# Patient Record
Sex: Female | Born: 1985 | Race: Asian | Hispanic: No | Marital: Married | Smoking: Never smoker
Health system: Southern US, Community
[De-identification: ages and names within clinical notes are randomized; demographics above are authoritative.]

## PROBLEM LIST (undated history)

## (undated) ENCOUNTER — Inpatient Hospital Stay (HOSPITAL_COMMUNITY): Payer: Self-pay

## (undated) DIAGNOSIS — F32A Depression, unspecified: Secondary | ICD-10-CM

## (undated) DIAGNOSIS — O24419 Gestational diabetes mellitus in pregnancy, unspecified control: Secondary | ICD-10-CM

## (undated) DIAGNOSIS — F329 Major depressive disorder, single episode, unspecified: Secondary | ICD-10-CM

## (undated) DIAGNOSIS — K219 Gastro-esophageal reflux disease without esophagitis: Secondary | ICD-10-CM

## (undated) HISTORY — PX: APPENDECTOMY: SHX54

---

## 2015-12-01 ENCOUNTER — Inpatient Hospital Stay (HOSPITAL_COMMUNITY): Payer: BLUE CROSS/BLUE SHIELD

## 2015-12-01 ENCOUNTER — Inpatient Hospital Stay (HOSPITAL_COMMUNITY)
Admission: AD | Admit: 2015-12-01 | Discharge: 2015-12-01 | Disposition: A | Discharge status: Home / Self Care | Payer: BLUE CROSS/BLUE SHIELD | Source: Ambulatory Visit | Attending: Obstetrics and Gynecology | Admitting: Obstetrics and Gynecology

## 2015-12-01 ENCOUNTER — Encounter (HOSPITAL_COMMUNITY): Payer: Self-pay | Admitting: *Deleted

## 2015-12-01 DIAGNOSIS — O209 Hemorrhage in early pregnancy, unspecified: Secondary | ICD-10-CM

## 2015-12-01 DIAGNOSIS — O26891 Other specified pregnancy related conditions, first trimester: Secondary | ICD-10-CM | POA: Diagnosis not present

## 2015-12-01 DIAGNOSIS — Z3491 Encounter for supervision of normal pregnancy, unspecified, first trimester: Secondary | ICD-10-CM

## 2015-12-01 DIAGNOSIS — F329 Major depressive disorder, single episode, unspecified: Secondary | ICD-10-CM | POA: Insufficient documentation

## 2015-12-01 DIAGNOSIS — R12 Heartburn: Secondary | ICD-10-CM | POA: Diagnosis not present

## 2015-12-01 DIAGNOSIS — O99341 Other mental disorders complicating pregnancy, first trimester: Secondary | ICD-10-CM | POA: Diagnosis not present

## 2015-12-01 DIAGNOSIS — O2631 Retained intrauterine contraceptive device in pregnancy, first trimester: Secondary | ICD-10-CM

## 2015-12-01 DIAGNOSIS — O210 Mild hyperemesis gravidarum: Secondary | ICD-10-CM | POA: Diagnosis not present

## 2015-12-01 DIAGNOSIS — O4691 Antepartum hemorrhage, unspecified, first trimester: Secondary | ICD-10-CM | POA: Diagnosis present

## 2015-12-01 DIAGNOSIS — Z3A01 Less than 8 weeks gestation of pregnancy: Secondary | ICD-10-CM | POA: Insufficient documentation

## 2015-12-01 DIAGNOSIS — Z30432 Encounter for removal of intrauterine contraceptive device: Secondary | ICD-10-CM

## 2015-12-01 DIAGNOSIS — O43891 Other placental disorders, first trimester: Secondary | ICD-10-CM

## 2015-12-01 DIAGNOSIS — Z79899 Other long term (current) drug therapy: Secondary | ICD-10-CM | POA: Diagnosis not present

## 2015-12-01 DIAGNOSIS — O219 Vomiting of pregnancy, unspecified: Secondary | ICD-10-CM

## 2015-12-01 HISTORY — DX: Depression, unspecified: F32.A

## 2015-12-01 HISTORY — DX: Major depressive disorder, single episode, unspecified: F32.9

## 2015-12-01 LAB — POCT PREGNANCY, URINE: Preg Test, Ur: POSITIVE — AB

## 2015-12-01 LAB — ABO/RH: ABO/RH(D): A POS

## 2015-12-01 LAB — HCG, QUANTITATIVE, PREGNANCY: hCG, Beta Chain, Quant, S: 48682 m[IU]/mL — ABNORMAL HIGH (ref <5)

## 2015-12-01 LAB — OB RESULTS CONSOLE RPR: RPR: NONREACTIVE

## 2015-12-01 LAB — URINALYSIS, ROUTINE W REFLEX MICROSCOPIC
Bilirubin Urine: NEGATIVE
GLUCOSE, UA: NEGATIVE mg/dL
KETONES UR: NEGATIVE mg/dL
Nitrite: NEGATIVE
PH: 6.5 (ref 5.0–8.0)
Protein, ur: NEGATIVE mg/dL
Specific Gravity, Urine: 1.01 (ref 1.005–1.030)

## 2015-12-01 LAB — WET PREP, GENITAL
CLUE CELLS WET PREP: NONE SEEN
SPERM: NONE SEEN
TRICH WET PREP: NONE SEEN
Yeast Wet Prep HPF POC: NONE SEEN

## 2015-12-01 LAB — OB RESULTS CONSOLE HEPATITIS B SURFACE ANTIGEN: HEP B S AG: NEGATIVE

## 2015-12-01 LAB — URINE MICROSCOPIC-ADD ON

## 2015-12-01 LAB — CBC
HEMATOCRIT: 35.2 % — AB (ref 36.0–46.0)
HEMOGLOBIN: 10.8 g/dL — AB (ref 12.0–15.0)
MCH: 21.1 pg — AB (ref 26.0–34.0)
MCHC: 30.7 g/dL (ref 30.0–36.0)
MCV: 68.8 fL — AB (ref 78.0–100.0)
PLATELETS: 260 10*3/uL (ref 150–400)
RBC: 5.12 MIL/uL — AB (ref 3.87–5.11)
RDW: 16 % — ABNORMAL HIGH (ref 11.5–15.5)
WBC: 12.4 10*3/uL — ABNORMAL HIGH (ref 4.0–10.5)

## 2015-12-01 LAB — OB RESULTS CONSOLE ABO/RH: RH TYPE: POSITIVE

## 2015-12-01 LAB — OB RESULTS CONSOLE RUBELLA ANTIBODY, IGM: Rubella: IMMUNE

## 2015-12-01 LAB — OB RESULTS CONSOLE ANTIBODY SCREEN: Antibody Screen: NEGATIVE

## 2015-12-01 LAB — OB RESULTS CONSOLE GC/CHLAMYDIA
Chlamydia: NEGATIVE
Gonorrhea: NEGATIVE

## 2015-12-01 LAB — OB RESULTS CONSOLE HIV ANTIBODY (ROUTINE TESTING): HIV: NONREACTIVE

## 2015-12-01 MED ORDER — RANITIDINE HCL 150 MG PO TABS: 150.0000 mg | ORAL_TABLET | Freq: Two times a day (BID) | ORAL | 0 refills | Status: AC

## 2015-12-01 MED ORDER — GI COCKTAIL ~~LOC~~
30.0000 mL | Freq: Once | ORAL | Status: AC
  Administered 2015-12-01: 30 mL via ORAL
  Filled 2015-12-01: qty 30

## 2015-12-01 MED ORDER — PROMETHAZINE HCL 25 MG PO TABS
12.5000 mg | ORAL_TABLET | Freq: Once | ORAL | Status: AC
  Administered 2015-12-01: 12.5 mg via ORAL
  Filled 2015-12-01: qty 1

## 2015-12-01 MED ORDER — PROMETHAZINE HCL 12.5 MG PO TABS: 12.5000 mg | ORAL_TABLET | Freq: Four times a day (QID) | ORAL | 0 refills | Status: DC | PRN

## 2015-12-01 NOTE — Discharge Instructions (Signed)
? nng trong khi mang thai  (Heartburn During Pregnancy) ? nng l m?t c?m gic nng rt ? ng?c do axit d? dy tro ng??c ln th?c qu?n gy ra. ? nng ph? bi?n trong th?i k? mang thai v m?t hocmon ? bi?t (progesterone) ???c gi?i phng ra khi ph? n? mang [place]. Hc mn progesterone c th? lm l?ng van ng?n cch th?c qu?n v?i d? dy. ?i?u ny lm cho axit ?i ng??c ln th?c qu?n, gy ? nng. ? nng c?ng c th? x?y ra trong khi mang [place] do t? cung to ra, ??y ng??c ln d? dy, ??y nhi?u axit h?n vo th?c qu?n. ?i?u ny ??c bi?t ?ng trong giai ?o?n cu?i c?a [place] k?. V?n ?? ? nng th??ng bi?n m?t sau khi sinh. NGUYN NHN.  ? nng do axit d? dy tro ng??c ln th?c qu?n gy ra. Trong khi mang thai, vi?c ny c th? do nhi?u nguyn nhn khc nhau, bao g?m:   Hc mn progesterone.  Thay ??i n?ng ?? hc mn.  T? cung pht tri?n, ??y axit d? dy ng??c ln.  B?a ?n no.  M?t s? th?c ph?m v ?? u?ng nh?t ??nh.  T?p luy?n.  T?ng s?n sinh axit. D?U HI?U V TRI?U CH?NG   ?au rt ? ng?c ho?c h?ng d??i.  C?m gic ??ng trong mi?ng.  Ho. CH?N ?ON  Chuyn gia ch?m [place] s?c kh?e s? th??ng ch?n ?on ? nng b?ng cch h?i k? ti?n s? cc v?n ?? lin quan c?a qu vi. C th? c?n xt nghi?m mu ?? ki?m tra m?t lo?i vi khu?n nh?t ??nh c lin quan t?i ? nng. ?i khi, ? nng ???c ch?n ?on b?ng k ??n dng thu?c ?i?u tr? ? nng ?? xem tri?u ch?ng c ???c c?i thi?n khng. Trong m?t s? tr??ng h?p c th? ph?i lm m?t th? thu?t c tn l n?i soi. Trong th? thu?t ny, m?t ?ng c ?n v m?t my ?nh ? ??u (?n n?i soi) ???c s? d?ng ?? ki?m tra th?c qu?n v d? dy. ?I?U TR?  Vi?c ?i?u tr? s? khc nhau ty thu?c vo m?c ?? n?ng c?a cc tri?u ch?ng. Chuyn gia ch?m [place] s?c kh?e c?a qu v? c th? khuy?n ngh?:  S? d?ng m?t s? thu?c khng c?n k ??n (thu?c trung ha axit d?ch v?, thu?c lm gi?m axit d?ch v?) ?? ?i?u tr? ? nng m?c ?? nh?.  Cc lo?i thu?c k ??n lm gi?m axit d? dy ho?c ?? b?o v? nim m?c d? dy.  M?t s?  thay ??i nh?t ??nh trong ch? ?? ?n c?a qu v?.  Nng cao ??u gi??ng c?a qu v? b?ng cch k cc c?c g?ch d??i chn gi??ng. Vi?c ny gip ng?n ng?a a xt trong d? dy khng tro ng??c ln th?c qu?n khi qu v? n?m. H??NG D?N CH?M Early T?I NH   Ch? s? d?ng thu?c khng c?n k ??n ho?c thu?c c?n k ??n theo ch? d?n c?a chuyn gia ch?m [place] s?c kh?e.  Nng cao ??u gi??ng c?a qu v? b?ng cch k cc c?c g?ch d??i chn gi??ng n?u chuyn gia ch?m [place] s?c kh?e yu c?u lm nh? v?y. Dng nhi?u g?i h?n khi ng? l khng hi?u qu? v ?i?u ? ch? lm thay ??i t? th? ??u c?a qu v?.  Khngt?p th? d?c ngay sau khi ?n.  Trnh ?n tr??c khi ?i ng? 2-3 ti?ng. Khng n?m ngay sau khi ?n.  ?n nhi?u b?a nh? trong ngy thay v ?  n 3 b?a no.  Xc ??nh cc lo?i th?c ph?m v ?? u?ng lm cho cc tri?u ch?ng t?i t? h?n v trnh dng chng. Cc lo?i th?c ph?m qu v? c th? mu?n trnh dng bao g?m:  H?t tiu.  S c la.  Th?c ?n nhi?u ch?t bo, bao g?m th?c ?n chin, rn.  Th?c ?n [place].  T?i v hnh.  Cc lo?i qu? thu?c gi?ng cam qut, g?m cam, b??i, chanh, v chanh l cam.  Th?c ph?m ch?a c chua ho?c cc s?n ph?m lm t? c chua.  B?c h.  ?? u?ng c ga v caffein.  Gi?m. ?I KHM N?U:  Qu v? b? b?t k? ki?u ?au b?ng no.  Qu v? c?m th?y nng ? b?ng trn ho?c ng?c, ??c bi?t l sau khi ?n ho?c n?m xu?ng.  Qu v? b? bu?n nn v nn m?a.  Qu v? c?m th?y kh ch?u trong d? dy sau khi ?n. NGAY L?P T?C ?I KHM N?U:   Qu v? b? ?au ng?c n?ng lan xu?ng cnh tay ho?c ra hm ho?c c?.  Qu v? c c?m gic ?? m? hi, hoa m?t hay chng m?t.  Qu v? th?y kh th?.  Qu v? nn ra mu.  Qu v? kh nu?t ho?c b? ?au khi nu?t.  Phn c?a qu v? c mu ho?c c mu ?en nh? h?c n.  Qu v? c nh?ng c?n ? nng nhi?u h?n 3 l?n m?i tu?n, trong h?n 2 tu?n. ??M B?O QU V?:  Hi?u r cc h??ng d?n ny.  S? theo di tnh tr?ng c?a mnh.  S? yu c?u tr? gip ngay l?p t?c n?u qu v? c?m th?y khng kh?e ho?c th?y tr?m  tr?ng h?n.   Thng tin ny khng nh?m m?c ?ch thay th? cho l?i khuyn m chuyn gia ch?m [place] s?c kh?e ni v?i qu v?. Hy b?o ??m qu v? ph?i th?o lu?n b?t k? v?n ?? g m qu v? c v?i chuyn gia ch?m [place] s?c kh?e c?a qu v?.   Document Released: 03/13/2005 Document Revised: 04/03/2014 Elsevier Interactive Patient Education 2016 Elsevier Inc. Pelvic Rest Pelvic rest is sometimes recommended for women when:   The placenta is partially or completely covering the opening of the cervix (placenta previa).  There is bleeding between the uterine wall and the amniotic sac in the first trimester (subchorionic hemorrhage).  The cervix begins to open without labor starting (incompetent cervix, cervical insufficiency).  The labor is too early (preterm labor). HOME CARE INSTRUCTIONS  Do not have sexual intercourse, stimulation, or an orgasm.  Do not use tampons, douche, or put anything in the vagina.  Do not lift anything over 10 pounds (4.5 kg).  Avoid strenuous activity or straining your pelvic muscles. SEEK MEDICAL CARE IF:  You have any vaginal bleeding during pregnancy. Treat this as a potential emergency.  You have cramping pain felt low in the stomach (stronger than menstrual cramps).  You notice vaginal discharge (watery, mucus, or bloody).  You have a low, dull backache.  There are regular contractions or uterine tightening. SEEK IMMEDIATE MEDICAL CARE IF: You have vaginal bleeding and have placenta previa.    This information is not intended to replace advice given to you by your health care provider. Make sure you discuss any questions you have with your health care provider.   Document Released: 07/08/2010 Document Revised: 06/05/2011 Document Reviewed: 09/14/2014 Elsevier Interactive Patient Education 2016 Elsevier Inc.  Subchorionic Hematoma A subchorionic hematoma is a gathering of  blood between the outer wall of the placenta and the inner wall of the womb  (uterus). The placenta is the organ that connects the fetus to the wall of the uterus. The placenta performs the feeding, breathing (oxygen to the fetus), and waste removal (excretory work) of the fetus.  Subchorionic hematoma is the most common abnormality found on a result from ultrasonography done during the first trimester or early second trimester of pregnancy. If there has been little or no vaginal bleeding, early small hematomas usually shrink on their own and do not affect your baby or pregnancy. The blood is gradually absorbed over 1-2 weeks. When bleeding starts later in pregnancy or the hematoma is larger or occurs in an older pregnant woman, the outcome may not be as good. Larger hematomas may get bigger, which increases the chances for miscarriage. Subchorionic hematoma also increases the risk of premature detachment of the placenta from the uterus, preterm (premature) labor, and stillbirth. HOME CARE INSTRUCTIONS  Stay on bed rest if your health care provider recommends this. Although bed rest will not prevent more bleeding or prevent a miscarriage, your health care provider may recommend bed rest until you are advised otherwise.  Avoid heavy lifting (more than 10 lb [4.5 kg]), exercise, sexual intercourse, or douching as directed by your health care provider.  Keep track of the number of pads you use each day and how soaked (saturated) they are. Write down this information.  Do not use tampons.  Keep all follow-up appointments as directed by your health care provider. Your health care provider may ask you to have follow-up blood tests or ultrasound tests or both. SEEK IMMEDIATE MEDICAL CARE IF:  You have severe cramps in your stomach, back, abdomen, or pelvis.  You have a fever.  You pass large clots or tissue. Save any tissue for your health care provider to look at.  Your bleeding increases or you become lightheaded, feel weak, or have fainting episodes.   This information is  not intended to replace advice given to you by your health care provider. Make sure you discuss any questions you have with your health care provider.   Document Released: 06/28/2006 Document Revised: 04/03/2014 Document Reviewed: 10/10/2012 Elsevier Interactive Patient Education 2016 Elsevier Inc.  Morning Sickness Morning sickness is when you feel sick to your stomach (nauseous) during pregnancy. You may feel sick to your stomach and throw up (vomit). You may feel sick in the morning, but you can feel this way any time of day. Some women feel very sick to their stomach and cannot stop throwing up (hyperemesis gravidarum). HOME CARE  Only take medicines as told by your doctor.  Take multivitamins as told by your doctor. Taking multivitamins before getting pregnant can stop or lessen the harshness of morning sickness.  Eat dry toast or unsalted crackers before getting out of bed.  Eat 5 to 6 small meals a day.  Eat dry and bland foods like rice and baked potatoes.  Do not drink liquids with meals. Drink between meals.  Do not eat greasy, fatty, or spicy foods.  Have someone cook for you if the smell of food causes you to feel sick or throw up.  If you feel sick to your stomach after taking prenatal vitamins, take them at night or with a snack.  Eat protein when you need a snack (nuts, yogurt, cheese).  Eat unsweetened gelatins for dessert.  Wear a bracelet used for sea sickness (acupressure wristband).  Go to a doctor  that puts thin needles into certain body points (acupuncture) to improve how you feel.  Do not smoke.  Use a humidifier to keep the air in your house free of odors.  Get lots of fresh air. GET HELP IF:  You need medicine to feel better.  You feel dizzy or lightheaded.  You are losing weight. GET HELP RIGHT AWAY IF:   You feel very sick to your stomach and cannot stop throwing up.  You pass out (faint). MAKE SURE YOU:  Understand these  instructions.  Will watch your condition.  Will get help right away if you are not doing well or get worse.   This information is not intended to replace advice given to you by your health care provider. Make sure you discuss any questions you have with your health care provider.   Document Released: 04/20/2004 Document Revised: 04/03/2014 Document Reviewed: 08/28/2012 Elsevier Interactive Patient Education [employer]

## 2015-12-01 NOTE — MAU Provider Note (Signed)
History     CSN: [id number]  Arrival date and time: 12/01/15 1424   First Provider Initiated Contact with Patient 12/01/15 1613      Chief Complaint  Patient presents with  . Vaginal Bleeding  . Morning Sickness   HPI  Erika Keith is a 30 y.o. G3P1011 at 2229w3d by LMP who presents with vaginal bleeding & nausea. Has IUD in place; placed 5 years ago in [place], doesn't know which IUD it is. Has positive HPT last week.  Reports pink spotting in underwear since this morning. Denies recent intercourse, vaginal discharge, lower abdominal pain, or dysuria.  Also reports n/v since 8/23. Has not vomited today but reports continued nausea. Does not have antiemetic at home. Some heartburn & epigastric soreness. Denies diarrhea or constipation.  Has scheduled initial prenatal visit with an ob[address] but not sure which one.   OB History    Gravida Para Term Preterm AB Living   3 1 1   1 1    SAB TAB Ectopic Multiple Live Births   1       1      Past Medical History:  Diagnosis Date  . Depression     Past Surgical History:  Procedure Laterality Date  . CESAREAN SECTION      Family History  Problem Relation Age of Onset  . Alcohol abuse Neg Hx   . Asthma Neg Hx   . Arthritis Neg Hx   . Birth defects Neg Hx   . Cancer Neg Hx   . COPD Neg Hx   . Depression Neg Hx   . Diabetes Neg Hx   . Drug abuse Neg Hx   . Early death Neg Hx   . Hearing loss Neg Hx   . Heart disease Neg Hx   . Hyperlipidemia Neg Hx   . Hypertension Neg Hx   . Kidney disease Neg Hx   . Learning disabilities Neg Hx   . Mental illness Neg Hx   . Mental retardation Neg Hx   . Miscarriages / Stillbirths Neg Hx   . Stroke Neg Hx   . Vision loss Neg Hx   . Varicose Veins Neg Hx     Social History  Substance Use Topics  . Smoking status: Never Smoker  . Smokeless tobacco: Never Used  . Alcohol use No    Allergies: No Known Allergies  Prescriptions Prior to Admission  Medication Sig  Dispense Refill Last Dose  . acetaminophen (TYLENOL) 325 MG tablet Take 650 mg by mouth every 6 (six) hours as needed for headache.   Past Week at Unknown time  . ibuprofen (ADVIL,MOTRIN) 200 MG tablet Take 200 mg by mouth every 6 (six) hours as needed for headache.   Past Month at Unknown time  . ketoconazole (NIZORAL) 2 % shampoo Apply 1 application topically 2 (two) times a week.  0 Past Week at Unknown time    Review of Systems  Constitutional: Negative.   Gastrointestinal: Positive for heartburn, nausea and vomiting. Negative for abdominal pain, constipation and diarrhea.  Genitourinary: Negative for dysuria.       + vaginal bleeding   Physical Exam   Blood pressure 96/57, pulse 78, temperature 98.2 F (36.8 C), temperature source Oral, resp. rate 16, weight 123 lb 8 oz (56 kg), last menstrual period 10/17/2015.  Physical Exam  Nursing note and vitals reviewed. Constitutional: She is oriented to person, place, and time. She appears well-developed and well-nourished. No distress.  HENT:  Head: Normocephalic and atraumatic.  Eyes: Conjunctivae are normal. Right eye exhibits no discharge. Left eye exhibits no discharge. No scleral icterus.  Neck: Normal range of motion.  Cardiovascular: Normal rate.   Respiratory: Effort normal. No respiratory distress.  GI: Soft. She exhibits no distension. There is no tenderness.  Genitourinary: No bleeding in the vagina. Vaginal discharge (small amount of thin tan discharge) found.  Genitourinary Comments: Cervix closed IUD removed  Neurological: She is alert and oriented to person, place, and time.  Skin: Skin is warm and dry. She is not diaphoretic.  Psychiatric: She has a normal mood and affect. Her behavior is normal. Judgment and thought content normal.    MAU Course  Procedures Results for orders placed or performed during the hospital encounter of 12/01/15 (from the past 24 hour(s))  Urinalysis, Routine w reflex microscopic (not at  [facility])     Status: Abnormal   Collection Time: 12/01/15  3:42 PM  Result Value Ref Range   Color, Urine YELLOW YELLOW   APPearance CLEAR CLEAR   Specific Gravity, Urine 1.010 1.005 - 1.030   pH 6.5 5.0 - 8.0   Glucose, UA NEGATIVE NEGATIVE mg/dL   Hgb urine dipstick SMALL (A) NEGATIVE   Bilirubin Urine NEGATIVE NEGATIVE   Ketones, ur NEGATIVE NEGATIVE mg/dL   Protein, ur NEGATIVE NEGATIVE mg/dL   Nitrite NEGATIVE NEGATIVE   Leukocytes, UA SMALL (A) NEGATIVE  Urine microscopic-add on     Status: Abnormal   Collection Time: 12/01/15  3:42 PM  Result Value Ref Range   Squamous Epithelial / LPF 0-5 (A) NONE SEEN   WBC, UA 6-30 0 - 5 WBC/hpf   RBC / HPF 0-5 0 - 5 RBC/hpf   Bacteria, UA FEW (A) NONE SEEN  Pregnancy, urine POC     Status: Abnormal   Collection Time: 12/01/15  3:54 PM  Result Value Ref Range   Preg Test, Ur POSITIVE (A) NEGATIVE  CBC     Status: Abnormal   Collection Time: 12/01/15  4:26 PM  Result Value Ref Range   WBC 12.4 (H) 4.0 - 10.5 K/uL   RBC 5.12 (H) 3.87 - 5.11 MIL/uL   Hemoglobin 10.8 (L) 12.0 - 15.0 g/dL   HCT 16.1 (L) 09.6 - 04.5 %   MCV 68.8 (L) 78.0 - 100.0 fL   MCH 21.1 (L) 26.0 - 34.0 pg   MCHC 30.7 30.0 - 36.0 g/dL   RDW 40.9 (H) 81.1 - 91.4 %   Platelets 260 150 - 400 K/uL  ABO/Rh     Status: None (Preliminary result)   Collection Time: 12/01/15  4:26 PM  Result Value Ref Range   ABO/RH(D) A POS   hCG, quantitative, pregnancy     Status: Abnormal   Collection Time: 12/01/15  4:26 PM  Result Value Ref Range   hCG, Beta Chain, Quant, S 78,295 (H) <5 mIU/mL  Wet prep, genital     Status: Abnormal   Collection Time: 12/01/15  4:40 PM  Result Value Ref Range   Yeast Wet Prep HPF POC NONE SEEN NONE SEEN   Trich, Wet Prep NONE SEEN NONE SEEN   Clue Cells Wet Prep HPF POC NONE SEEN NONE SEEN   WBC, Wet Prep HPF POC MODERATE (A) NONE SEEN   Sperm NONE SEEN    US Ob Comp Less 14 Wks  Result Date: 12/01/2015 CLINICAL DATA:  Vaginal bleeding,  first trimester pregnancy. EXAM: OBSTETRIC <14 WK [place] AND TRANSVAGINAL OB US  TECHNIQUE: Both transabdominal and transvaginal ultrasound examinations were performed for complete evaluation of the gestation as well as the maternal uterus, adnexal regions, and pelvic cul-de-sac. Transvaginal technique was performed to assess early pregnancy. COMPARISON:  None. FINDINGS: Intrauterine gestational sac: Single. Yolk sac:  Present. Embryo:  Present. Cardiac Activity: Present. Heart Rate: 105  bpm CRL:  2.1  mm   5 w   5 d                  [place] EDC: 07/28/2016 Subchorionic hemorrhage: Small, measuring approximately 1.7 x 0.9 x 1.4 cm. Maternal uterus/adnexae: Ovaries are visualized. At least 2 fibroids are noted. No free fluid. IMPRESSION: 1. Single living intrauterine pregnancy with gestational age of [redacted] weeks 5 days and estimated date of confinement of 07/28/2016. 2. Small subchorionic hemorrhage. Electronically Signed   By: Leanna Battles M.D.   On: 12/01/2015 17:51   US Ob Transvaginal  Result Date: 12/01/2015 CLINICAL DATA:  Vaginal bleeding, first trimester pregnancy. EXAM: OBSTETRIC <14 WK [place] AND TRANSVAGINAL OB US TECHNIQUE: Both transabdominal and transvaginal ultrasound examinations were performed for complete evaluation of the gestation as well as the maternal uterus, adnexal regions, and pelvic cul-de-sac. Transvaginal technique was performed to assess early pregnancy. COMPARISON:  None. FINDINGS: Intrauterine gestational sac: Single. Yolk sac:  Present. Embryo:  Present. Cardiac Activity: Present. Heart Rate: 105  bpm CRL:  2.1  mm   5 w   5 d                  [place] EDC: 07/28/2016 Subchorionic hemorrhage: Small, measuring approximately 1.7 x 0.9 x 1.4 cm. Maternal uterus/adnexae: Ovaries are visualized. At least 2 fibroids are noted. No free fluid. IMPRESSION: 1. Single living intrauterine pregnancy with gestational age of [redacted] weeks 5 days and estimated date of confinement of 07/28/2016. 2. Small subchorionic  hemorrhage. Electronically Signed   By: Leanna Battles M.D.   On: 12/01/2015 17:51    MDM +UPT UA, wet prep, GC/chlamydia, CBC, ABO/Rh, quant hCG, HIV, RPR and [place] today to rule out ectopic pregnancy A positive Ultrasound shows SIUP with cardiac activity; small SCH Assessment and Plan  A: 1. Normal IUP (intrauterine pregnancy) on prenatal ultrasound, first trimester   2. Vaginal bleeding in pregnancy, first trimester   3. Encounter for IUD removal   4. Subchorionic hematoma, first trimester   5. Nausea and vomiting during pregnancy prior to [redacted] weeks gestation   6. Heartburn during pregnancy, first trimester     P: Discharge home Rx phenergan & zantac Pelvic rest Start prenatal care as scheduled Discussed reasons to return to MAU GC/CT pending  Judeth Horn 12/01/2015, 4:13 PM

## 2015-12-01 NOTE — MAU Note (Signed)
+  HPT last Friday.   Has an IUD. Has pain in upper abd.  Has been throwing up. Has been bleeding, started when she expected to get period, continues.

## 2015-12-02 LAB — GC/CHLAMYDIA PROBE AMP (~~LOC~~) NOT AT ARMC
CHLAMYDIA, DNA PROBE: NEGATIVE
Neisseria Gonorrhea: NEGATIVE

## 2015-12-02 LAB — HIV ANTIBODY (ROUTINE TESTING W REFLEX): HIV Screen 4th Generation wRfx: NONREACTIVE

## 2015-12-15 ENCOUNTER — Other Ambulatory Visit: Payer: Self-pay | Admitting: Obstetrics and Gynecology

## 2015-12-16 LAB — CYTOLOGY - PAP

## 2016-03-09 ENCOUNTER — Encounter (HOSPITAL_COMMUNITY): Payer: Self-pay | Admitting: Obstetrics and Gynecology

## 2016-03-09 ENCOUNTER — Other Ambulatory Visit (HOSPITAL_COMMUNITY): Payer: Self-pay | Admitting: Obstetrics and Gynecology

## 2016-03-09 DIAGNOSIS — O283 Abnormal ultrasonic finding on antenatal screening of mother: Secondary | ICD-10-CM

## 2016-03-09 DIAGNOSIS — Z3689 Encounter for other specified antenatal screening: Secondary | ICD-10-CM

## 2016-03-09 DIAGNOSIS — Z3A21 21 weeks gestation of pregnancy: Secondary | ICD-10-CM

## 2016-03-14 ENCOUNTER — Encounter (HOSPITAL_COMMUNITY): Payer: Self-pay

## 2016-03-14 ENCOUNTER — Ambulatory Visit (HOSPITAL_COMMUNITY)
Admission: RE | Admit: 2016-03-14 | Discharge: 2016-03-14 | Disposition: A | Discharge status: Home / Self Care | Payer: BLUE CROSS/BLUE SHIELD | Source: Ambulatory Visit | Attending: Obstetrics and Gynecology | Admitting: Obstetrics and Gynecology

## 2016-03-14 ENCOUNTER — Ambulatory Visit (HOSPITAL_COMMUNITY): Admission: RE | Admit: 2016-03-14 | Payer: BLUE CROSS/BLUE SHIELD | Source: Ambulatory Visit

## 2016-03-14 ENCOUNTER — Other Ambulatory Visit (HOSPITAL_COMMUNITY): Payer: Self-pay | Admitting: *Deleted

## 2016-03-14 DIAGNOSIS — Z3A21 21 weeks gestation of pregnancy: Secondary | ICD-10-CM | POA: Insufficient documentation

## 2016-03-14 DIAGNOSIS — Z363 Encounter for antenatal screening for malformations: Secondary | ICD-10-CM | POA: Diagnosis present

## 2016-03-14 DIAGNOSIS — R6252 Short stature (child): Secondary | ICD-10-CM

## 2016-03-14 DIAGNOSIS — O283 Abnormal ultrasonic finding on antenatal screening of mother: Secondary | ICD-10-CM | POA: Insufficient documentation

## 2016-03-14 DIAGNOSIS — O4402 Placenta previa specified as without hemorrhage, second trimester: Secondary | ICD-10-CM | POA: Insufficient documentation

## 2016-03-14 DIAGNOSIS — Z3689 Encounter for other specified antenatal screening: Secondary | ICD-10-CM

## 2016-03-14 DIAGNOSIS — O365920 Maternal care for other known or suspected poor fetal growth, second trimester, not applicable or unspecified: Secondary | ICD-10-CM

## 2016-03-14 DIAGNOSIS — O34211 Maternal care for low transverse scar from previous cesarean delivery: Secondary | ICD-10-CM | POA: Diagnosis not present

## 2016-03-14 NOTE — Progress Notes (Signed)
Maternal Fetal Medicine Consultation  Requesting Provider(s): Horvath  Primary Ob: Henderson CloudHorvath Reason for consultation: Suspected early-onset growth restriction  HPI: 30yo [place] female P1021 at 21+2 weeks, referred for abnormal head biometry, suspected nuchal thickening and EIF. This pregnancy has been unremarkable so far. She had a normal first trimester screen  OB History: 2 previous voluntary terminations of pregnancy One previous C/S for failure to progress, that infant was female and weighed c. 3800g  PMH:  Past Medical History:  Diagnosis Date  . Depression     PSH:  Past Surgical History:  Procedure Laterality Date  . APPENDECTOMY    . CESAREAN SECTION     Meds: PNV Allergies: NKDA Fh: See EPIC section Soc: See EPIC section  Review of Systems: no vaginal bleeding or cramping/contractions, no LOF, no nausea/vomiting. All other systems reviewed and are negative.  PNL:   PE: See EPIC section   See separate report for full ultrasound results  A/P: No evidence of significant US abnormalities. There is a small nonpathologic EIF in the mitral complex. The neck anatomy is normal. Although the BPD is 6th  percentile, the [facility] is normal and growth is at the 31st percentile. Using [facility] weight tables for [place], her growth is closer to the 50th percentile. I have asked her to return in 4 weeks and we will reassess fetal parameters and if stable we can most likely discontinue our involvement. Genetic counseling has been cancelled for today. There is a low-lying placenta with an inferior edge 1.2cm from the os, but this will most likely resolve. We will check for it on our next scan  Thank you for the opportunity to be a part of the care of [facility]. Please contact our office if we can be of further assistance.   I spent approximately 15 minutes with this patient with over 50% of time spent in face-to-face counseling.

## 2016-04-12 ENCOUNTER — Ambulatory Visit (HOSPITAL_COMMUNITY)
Admission: RE | Admit: 2016-04-12 | Discharge: 2016-04-12 | Disposition: A | Discharge status: Home / Self Care | Payer: BLUE CROSS/BLUE SHIELD | Source: Ambulatory Visit | Attending: Obstetrics and Gynecology | Admitting: Obstetrics and Gynecology

## 2016-04-26 ENCOUNTER — Ambulatory Visit (HOSPITAL_COMMUNITY)
Admission: RE | Admit: 2016-04-26 | Discharge: 2016-04-26 | Disposition: A | Discharge status: Home / Self Care | Payer: BLUE CROSS/BLUE SHIELD | Source: Ambulatory Visit | Attending: Obstetrics and Gynecology | Admitting: Obstetrics and Gynecology

## 2016-04-26 ENCOUNTER — Encounter (HOSPITAL_COMMUNITY): Payer: Self-pay

## 2016-04-26 DIAGNOSIS — Z3689 Encounter for other specified antenatal screening: Secondary | ICD-10-CM | POA: Diagnosis not present

## 2016-04-26 DIAGNOSIS — R6252 Short stature (child): Secondary | ICD-10-CM

## 2016-04-26 DIAGNOSIS — Z3A27 27 weeks gestation of pregnancy: Secondary | ICD-10-CM | POA: Insufficient documentation

## 2016-06-08 ENCOUNTER — Encounter: Payer: BLUE CROSS/BLUE SHIELD | Attending: Obstetrics and Gynecology | Admitting: *Deleted

## 2016-06-08 DIAGNOSIS — Z3A00 Weeks of gestation of pregnancy not specified: Secondary | ICD-10-CM | POA: Insufficient documentation

## 2016-06-08 DIAGNOSIS — R7309 Other abnormal glucose: Secondary | ICD-10-CM

## 2016-06-08 DIAGNOSIS — Z713 Dietary counseling and surveillance: Secondary | ICD-10-CM | POA: Insufficient documentation

## 2016-06-08 DIAGNOSIS — O99810 Abnormal glucose complicating pregnancy: Secondary | ICD-10-CM | POA: Diagnosis present

## 2016-06-08 NOTE — Progress Notes (Signed)
  Patient was seen on 06/08/2016 for Gestational Diabetes self-management. She speaks [place], we used [employer] Interpretor successfully and provided written instructions in [place].  The following learning objectives were met by the patient :   States the definition of Gestational Diabetes  States why dietary management is important in controlling blood glucose  Describes the effects of carbohydrates on blood glucose levels  Demonstrates ability to create a balanced meal plan  Demonstrates carbohydrate counting   States when to check blood glucose levels  Demonstrates proper blood glucose monitoring techniques  States the effect of stress and exercise on blood glucose levels  States the importance of limiting caffeine and abstaining from alcohol and smoking  Plan:  Aim for 3 Carb Choices per meal (45 grams) +/- 1 either way  Aim for 1-2 Carbs per snack Begin reading food labels for Total Carbohydrate of foods Consider  increasing your activity level by walking or other activity daily as tolerated Begin checking BG before breakfast and 2 hours after first bite of breakfast, lunch and dinner as directed by MD  Take medication if directed by MD  Blood glucose monitor given: [employer] Lot # [id number] Exp: 12/24/2016 Blood glucose reading: 99 mg/dl  Patient instructed to monitor glucose levels: FBS: 60 - <90 2 hour: <120  Patient received the following handouts:  Nutrition Diabetes and Pregnancy in [place] List  Patient will be seen for follow-up as needed.

## 2016-06-20 ENCOUNTER — Other Ambulatory Visit: Payer: Self-pay | Admitting: Obstetrics & Gynecology

## 2016-06-20 ENCOUNTER — Other Ambulatory Visit (HOSPITAL_COMMUNITY): Payer: Self-pay | Admitting: Obstetrics and Gynecology

## 2016-06-20 LAB — OB RESULTS CONSOLE GBS: STREP GROUP B AG: NEGATIVE

## 2016-07-09 ENCOUNTER — Inpatient Hospital Stay (HOSPITAL_COMMUNITY)
Admission: AD | Admit: 2016-07-09 | Discharge: 2016-07-09 | Disposition: A | Discharge status: Home / Self Care | Payer: BLUE CROSS/BLUE SHIELD | Source: Ambulatory Visit | Attending: Obstetrics and Gynecology | Admitting: Obstetrics and Gynecology

## 2016-07-09 ENCOUNTER — Encounter (HOSPITAL_COMMUNITY): Payer: Self-pay

## 2016-07-09 DIAGNOSIS — O34211 Maternal care for low transverse scar from previous cesarean delivery: Secondary | ICD-10-CM | POA: Insufficient documentation

## 2016-07-09 DIAGNOSIS — O471 False labor at or after 37 completed weeks of gestation: Secondary | ICD-10-CM | POA: Insufficient documentation

## 2016-07-09 DIAGNOSIS — Z3A38 38 weeks gestation of pregnancy: Secondary | ICD-10-CM | POA: Insufficient documentation

## 2016-07-09 DIAGNOSIS — R109 Unspecified abdominal pain: Secondary | ICD-10-CM | POA: Diagnosis present

## 2016-07-09 LAB — URINALYSIS, ROUTINE W REFLEX MICROSCOPIC
BILIRUBIN URINE: NEGATIVE
Glucose, UA: NEGATIVE mg/dL
HGB URINE DIPSTICK: NEGATIVE
KETONES UR: NEGATIVE mg/dL
Leukocytes, UA: NEGATIVE
NITRITE: NEGATIVE
PROTEIN: NEGATIVE mg/dL
SPECIFIC GRAVITY, URINE: 1.017 (ref 1.005–1.030)
pH: 7 (ref 5.0–8.0)

## 2016-07-09 LAB — AMNISURE RUPTURE OF MEMBRANE (ROM) NOT AT ARMC: AMNISURE: NEGATIVE

## 2016-07-09 MED ORDER — NIFEDIPINE 10 MG PO CAPS
10.0000 mg | ORAL_CAPSULE | Freq: Once | ORAL | Status: AC
  Administered 2016-07-09: 10 mg via ORAL
  Filled 2016-07-09: qty 1

## 2016-07-09 MED ORDER — OXYCODONE-ACETAMINOPHEN 5-325 MG PO TABS: 1.0000 | ORAL_TABLET | Freq: Once | ORAL | Status: DC

## 2016-07-09 NOTE — MAU Note (Signed)
Pt presents to MAU with complaints of lower abdominal pain. Pt is scheduled for a repeat cesarean section on April the 25th for placenta previa and breech presentation. Denies vaginal bleeding reports white vaginal discharge

## 2016-07-09 NOTE — MAU Note (Signed)
Spoke with Dr Tenny Craw and gave her exam results (previa is resolved) 0/thick/ballotable. Dr Tenny Craw would like to give patient procardia to stop labor. Will pass on to MAU provider to see patient.

## 2016-07-09 NOTE — Discharge Instructions (Signed)
Return if there is leaking of fluid, vaginal bleeding or increasing contractions. Keep your appointment in the office on Tuesday. Call your doctor if you have concerns before then. Drink at least 8 8-oz glasses of water every day.

## 2016-07-09 NOTE — MAU Provider Note (Signed)
History     CSN: [id number]  Arrival date and time: 07/09/16 1146  Client seen by provider at 1310 - [employer] used via machine in client's room for [employer]  Patient presents with  . Abdominal Pain   HPI Erika Keith 31 y.o.  [redacted]w[redacted]d  Comes to MAU with white vaginal discharge that is more than she usually notices and abdominal pain - thinking the baby is moving down and is concerned that she is in labor.  The contractions came frequently yesterday, then stopped and came back again today.  Baby is moving well.  Is concerned that the fluid around the baby is leaking. Client is scheduled for a repeat C/S on 07-19-16.  Her next office visit is on 07-11-16.   OB History    Gravida Para Term Preterm AB Living   SAB TAB Ectopic Multiple Live Births   1       1      Past Medical History:  Diagnosis Date  . Depression     Past Surgical History:  Procedure Laterality Date  . APPENDECTOMY    . CESAREAN SECTION      Family History  Problem Relation Age of Onset  . Alcohol abuse Neg Hx   . Asthma Neg Hx   . Arthritis Neg Hx   . Birth defects Neg Hx   . Cancer Neg Hx   . COPD Neg Hx   . Depression Neg Hx   . Diabetes Neg Hx   . Drug abuse Neg Hx   . Early death Neg Hx   . Hearing loss Neg Hx   . Heart disease Neg Hx   . Hyperlipidemia Neg Hx   . Hypertension Neg Hx   . Kidney disease Neg Hx   . Learning disabilities Neg Hx   . Mental illness Neg Hx   . Mental retardation Neg Hx   . Miscarriages / Stillbirths Neg Hx   . Stroke Neg Hx   . Vision loss Neg Hx   . Varicose Veins Neg Hx     Social History  Substance Use Topics  . Smoking status: Never Smoker  . Smokeless tobacco: Never Used  . Alcohol use No    Allergies: No Known Allergies  Prescriptions Prior to Admission  Medication Sig Dispense Refill Last Dose  . acetaminophen (TYLENOL) 325 MG tablet Take 650 mg by mouth every 6 (six) hours as needed for headache.   Taking   . ketoconazole (NIZORAL) 2 % shampoo Apply 1 application topically 2 (two) times a week.  0 Not Taking  . Prenatal Vit-Fe Fumarate-FA (PRENATAL VITAMIN PO) Take by mouth.   Taking  . promethazine (PHENERGAN) 12.5 MG tablet Take 1 tablet (12.5 mg total) by mouth every 6 (six) hours as needed for nausea or vomiting. (Patient not taking: Reported on 03/14/2016) 30 tablet 0 Not Taking  . ranitidine (ZANTAC) 150 MG tablet Take 1 tablet (150 mg total) by mouth 2 (two) times daily. (Patient not taking: Reported on 03/14/2016) 60 tablet 0 Not Taking    Review of Systems  Constitutional: Negative for fever.  Gastrointestinal: Positive for abdominal pain and nausea. Negative for vomiting.  Genitourinary: Positive for vaginal discharge. Negative for dysuria and vaginal bleeding.   Physical Exam   Blood pressure 115/65, pulse (!) 109, temperature 98 F (36.7 C), resp. rate 18, height  (1.575 m), weight 155 lb (70.3 kg),  last menstrual period 10/17/2015.  Physical Exam  Nursing note and vitals reviewed. Constitutional: She is oriented to person, place, and time. She appears well-developed and well-nourished.  HENT:  Head: Normocephalic.  Eyes: EOM are normal.  Neck: Neck supple.  GI: Soft. There is no tenderness.  Fetal monitor strip shows FHT baseline 135 with moderate variability and 15x15 accels noted.  No decelerations.  Contractions irregular at 5-10 minutes.  Genitourinary:  Genitourinary Comments: RN did cervical exam - not dilated. Slide to damp perineum prior to amnisure. Speculum exam  - Moderate amount of creamy white discharge seen. No pooling.  No leaking of fluid from cervix during valsalva - clinical appearance suggestive of no ROM   Musculoskeletal: Normal range of motion.  Neurological: She is alert and oriented to person, place, and time.  Skin: Skin is warm and dry.  Psychiatric: She has a normal mood and affect.    MAU Course  Procedures Results for orders placed or  performed during the hospital encounter of 07/09/16 (from the past 24 hour(s))  Urinalysis, Routine w reflex microscopic     Status: None   Collection Time: 07/09/16 12:04 PM  Result Value Ref Range   Color, Urine YELLOW YELLOW   APPearance CLEAR CLEAR   Specific Gravity, Urine 1.017 1.005 - 1.030   pH 7.0 5.0 - 8.0   Glucose, UA NEGATIVE NEGATIVE mg/dL   Hgb urine dipstick NEGATIVE NEGATIVE   Bilirubin Urine NEGATIVE NEGATIVE   Ketones, ur NEGATIVE NEGATIVE mg/dL   Protein, ur NEGATIVE NEGATIVE mg/dL   Nitrite NEGATIVE NEGATIVE   Leukocytes, UA NEGATIVE NEGATIVE  Amnisure rupture of membrane (rom)not at [facility]     Status: None   Collection Time: 07/09/16  1:22 PM  Result Value Ref Range   Amnisure ROM NEGATIVE     MDM Client continues to have irregular contractions and is worried about labor.  She continues to express concern about the vaginal discharge which she says is different than usual - will do an amnisure. Slide to perineum showed ferning verified by 2 RNs. Amnisure negative and speculum exam negative for evidence of ROM. Dr. Tenny Craw contacted by phone and reviewed plan of care - possible for fern slide to show ferning from cervical mucus, Will give Procardia 10 mg PO to help contractions decrease - will send home.  Assessment and Plan  False Labor with Braxton Hicks contractions No rupture of membranes  Plan Return if there is leaking of fluid, vaginal bleeding or increasing contractions. Keep your appointment in the office on Tuesday. Call your doctor if you have concerns before then. Drink at least 8 8-oz glasses of water every day.    Harlea Goetzinger L Chanceler Pullin 07/09/2016, 1:21 PM

## 2016-07-12 ENCOUNTER — Telehealth (HOSPITAL_COMMUNITY): Payer: Self-pay | Admitting: *Deleted

## 2016-07-12 ENCOUNTER — Encounter (HOSPITAL_COMMUNITY): Payer: Self-pay

## 2016-07-12 NOTE — Telephone Encounter (Signed)
Preadmission screen  

## 2016-07-12 NOTE — Pre-Procedure Instructions (Signed)
Interpreter number [phone]

## 2016-07-13 ENCOUNTER — Telehealth (HOSPITAL_COMMUNITY): Payer: Self-pay | Admitting: *Deleted

## 2016-07-13 NOTE — Pre-Procedure Instructions (Signed)
[phone] interpreter number

## 2016-07-13 NOTE — Telephone Encounter (Signed)
Preadmission screen  

## 2016-07-14 ENCOUNTER — Telehealth (HOSPITAL_COMMUNITY): Payer: Self-pay | Admitting: *Deleted

## 2016-07-14 NOTE — Telephone Encounter (Signed)
Preadmission screen  

## 2016-07-14 NOTE — Pre-Procedure Instructions (Signed)
Interpreter number [phone]

## 2016-07-17 ENCOUNTER — Telehealth (HOSPITAL_COMMUNITY): Payer: Self-pay | Admitting: *Deleted

## 2016-07-17 ENCOUNTER — Encounter (HOSPITAL_COMMUNITY): Payer: Self-pay

## 2016-07-17 NOTE — Telephone Encounter (Signed)
Preadmission screen  

## 2016-07-17 NOTE — Pre-Procedure Instructions (Signed)
Interpreter number [phone] Preadmission screen pts sister called and left another number to reach pt. [phone]

## 2016-07-17 NOTE — Pre-Procedure Instructions (Signed)
Interpreter number [phone] Preadmission screen Left message

## 2016-07-17 NOTE — Pre-Procedure Instructions (Signed)
Left message with office on 4/20 and 4/23 regarding inability to contact pt

## 2016-07-18 ENCOUNTER — Encounter (HOSPITAL_COMMUNITY)
Admission: RE | Admit: 2016-07-18 | Discharge: 2016-07-18 | Disposition: A | Discharge status: Home / Self Care | Payer: BLUE CROSS/BLUE SHIELD | Source: Ambulatory Visit | Attending: Obstetrics and Gynecology | Admitting: Obstetrics and Gynecology

## 2016-07-18 ENCOUNTER — Other Ambulatory Visit: Payer: Self-pay | Admitting: Obstetrics and Gynecology

## 2016-07-18 HISTORY — DX: Gestational diabetes mellitus in pregnancy, unspecified control: O24.419

## 2016-07-18 HISTORY — DX: Gastro-esophageal reflux disease without esophagitis: K21.9

## 2016-07-18 LAB — CBC
HCT: 38.7 % (ref 36.0–46.0)
Hemoglobin: 12.3 g/dL (ref 12.0–15.0)
MCH: 26.2 pg (ref 26.0–34.0)
MCHC: 31.8 g/dL (ref 30.0–36.0)
MCV: 82.3 fL (ref 78.0–100.0)
PLATELETS: 130 10*3/uL — AB (ref 150–400)
RBC: 4.7 MIL/uL (ref 3.87–5.11)
RDW: 16.5 % — ABNORMAL HIGH (ref 11.5–15.5)
WBC: 8.1 10*3/uL (ref 4.0–10.5)

## 2016-07-18 LAB — TYPE AND SCREEN
ABO/RH(D): A POS
ANTIBODY SCREEN: NEGATIVE

## 2016-07-18 NOTE — H&P (Signed)
31 y.o.  [id number] [redacted]w[redacted]d comes in for a repeat cesarean section at term.  Patient has good fetal movement and no bleeding. A1GDM, no meds, fasting BS <81, 2 hr. pp BS <118; GBS-neg; RCS/BTL with MH on 07/19/16; Pt. denies any problems, no LOF, active FM. D/w pt with [place] Interpreter-. All Risks, benefits, alternatives to both RCS and BTL d/w pt in depth at that visit with intrepreter. All questions answered.  Past Medical History:  Diagnosis Date  . Depression   . GERD (gastroesophageal reflux disease)   . Gestational diabetes    diet controlled    Past Surgical History:  Procedure Laterality Date  . APPENDECTOMY    . CESAREAN SECTION      OB History  Gravida Para Term Preterm AB Living  SAB TAB Ectopic Multiple Live Births  1       1    # Outcome Date GA Lbr Len/2nd Weight Sex Delivery Anes PTL Lv  3 Current           2 SAB 2015          1 Term 04/2012 [redacted]w[redacted]d  8 lb 3 oz (3.714 kg) M CS-LTranv  N LIV     Birth Comments: delivered in [place]      Social History   Social History  . Marital status: Married    Spouse name: N/A  . Number of children: N/A  . Years of education: N/A   Occupational History  . Not on file.   Social History Main Topics  . Smoking status: Never Smoker  . Smokeless tobacco: Never Used  . Alcohol use No  . Drug use: No  . Sexual activity: Yes   Other Topics Concern  . Not on file   Social History Narrative  . No narrative on file   Patient has no known allergies.   Prenatal Course: uncomplicated except for A1GDM.   Prenatal Transfer Tool  Maternal Diabetes: Yes:  Diabetes Type:  Diet controlled Genetic Screening: Normal Maternal Ultrasounds/Referrals: Normal Fetal Ultrasounds or other Referrals:  None Maternal Substance Abuse:  No Significant Maternal Medications:  None Significant Maternal Lab Results: None  There were no vitals filed for this visit.  Lungs/Cor:  NAD Abdomen:  soft, gravid Ex:  no cords,  erythema SVE:  NA FHTs:  present  A/P   For repeat cesarean sectionat term.  Pt also desires BTL.  All risks, benefits and alternatives discussed with patient and she desires to proceed.  Eulogio Requena A

## 2016-07-18 NOTE — Patient Instructions (Signed)
20 Denell Cothern  07/18/2016   Your procedure is scheduled on:  07/19/2016  Enter through the Main Entrance of [facility] at 1030 AM.  Pick up the phone at the desk and dial [phone].   Call this number if you have problems the morning of surgery: [phone]   Remember:   Do not eat food:After Midnight.  Do not drink clear liquids: After Midnight.  Take these medicines the morning of surgery with A SIP OF WATER: none   Do not wear jewelry, make-up or nail polish.  Do not wear lotions, powders, or perfumes. Do not wear deodorant.  Do not shave 48 hours prior to surgery.  Do not bring valuables to the hospital.  [facility] is not   responsible for any belongings or valuables brought to the hospital.  Contacts, dentures or bridgework may not be worn into surgery.  Leave suitcase in the car. After surgery it may be brought to your room.  For patients admitted to the hospital, checkout time is 11:00 AM the day of              discharge.   Patients discharged the day of surgery will not be allowed to drive             home.  Name and phone number of your driver: na  Special Instructions:   N/A   Please read over the following fact sheets that you were given:   Surgical Site Infection Prevention

## 2016-07-19 ENCOUNTER — Encounter (HOSPITAL_COMMUNITY): Payer: Self-pay | Admitting: *Deleted

## 2016-07-19 ENCOUNTER — Inpatient Hospital Stay (HOSPITAL_COMMUNITY): Payer: BLUE CROSS/BLUE SHIELD | Admitting: Anesthesiology

## 2016-07-19 ENCOUNTER — Inpatient Hospital Stay (HOSPITAL_COMMUNITY)
Admission: RE | Admit: 2016-07-19 | Discharge: 2016-07-21 | DRG: 766 | Disposition: A | Discharge status: Home / Self Care | Payer: BLUE CROSS/BLUE SHIELD | Source: Ambulatory Visit | Attending: Obstetrics and Gynecology | Admitting: Obstetrics and Gynecology

## 2016-07-19 ENCOUNTER — Encounter (HOSPITAL_COMMUNITY)
Admission: RE | Disposition: A | Discharge status: Home / Self Care | Payer: Self-pay | Source: Ambulatory Visit | Attending: Obstetrics and Gynecology

## 2016-07-19 DIAGNOSIS — E669 Obesity, unspecified: Secondary | ICD-10-CM | POA: Diagnosis present

## 2016-07-19 DIAGNOSIS — O24420 Gestational diabetes mellitus in childbirth, diet controlled: Secondary | ICD-10-CM | POA: Diagnosis present

## 2016-07-19 DIAGNOSIS — Z6830 Body mass index (BMI) 30.0-30.9, adult: Secondary | ICD-10-CM | POA: Diagnosis not present

## 2016-07-19 DIAGNOSIS — O99214 Obesity complicating childbirth: Secondary | ICD-10-CM | POA: Diagnosis present

## 2016-07-19 DIAGNOSIS — O34211 Maternal care for low transverse scar from previous cesarean delivery: Secondary | ICD-10-CM | POA: Diagnosis present

## 2016-07-19 DIAGNOSIS — Z3A39 39 weeks gestation of pregnancy: Secondary | ICD-10-CM | POA: Diagnosis not present

## 2016-07-19 DIAGNOSIS — Z302 Encounter for sterilization: Secondary | ICD-10-CM | POA: Diagnosis not present

## 2016-07-19 DIAGNOSIS — K219 Gastro-esophageal reflux disease without esophagitis: Secondary | ICD-10-CM | POA: Diagnosis present

## 2016-07-19 DIAGNOSIS — O9962 Diseases of the digestive system complicating childbirth: Secondary | ICD-10-CM | POA: Diagnosis present

## 2016-07-19 DIAGNOSIS — Z9889 Other specified postprocedural states: Secondary | ICD-10-CM

## 2016-07-19 LAB — RPR: RPR Ser Ql: NONREACTIVE

## 2016-07-19 SURGERY — Surgical Case
Anesthesia: Spinal | Laterality: Bilateral | Wound class: Clean Contaminated

## 2016-07-19 MED ORDER — SENNOSIDES-DOCUSATE SODIUM 8.6-50 MG PO TABS
2.0000 | ORAL_TABLET | ORAL | Status: DC
  Administered 2016-07-19 – 2016-07-20 (×2): 2 via ORAL
  Filled 2016-07-19 (×4): qty 2

## 2016-07-19 MED ORDER — FENTANYL CITRATE (PF) 100 MCG/2ML IJ SOLN
INTRAMUSCULAR | Status: AC
Start: 2016-07-19 — End: 2016-07-19
  Filled 2016-07-19: qty 2

## 2016-07-19 MED ORDER — NALBUPHINE HCL 10 MG/ML IJ SOLN: 5.0000 mg | Freq: Once | INTRAMUSCULAR | Status: AC | PRN

## 2016-07-19 MED ORDER — ACETAMINOPHEN 500 MG PO TABS
1000.0000 mg | ORAL_TABLET | Freq: Four times a day (QID) | ORAL | Status: AC
  Administered 2016-07-19 – 2016-07-20 (×2): 1000 mg via ORAL
  Filled 2016-07-19 (×3): qty 2

## 2016-07-19 MED ORDER — EPHEDRINE 5 MG/ML INJ
INTRAVENOUS | Status: AC
  Filled 2016-07-19: qty 10

## 2016-07-19 MED ORDER — FERROUS SULFATE 325 (65 FE) MG PO TABS
325.0000 mg | ORAL_TABLET | Freq: Two times a day (BID) | ORAL | Status: DC
  Administered 2016-07-20 (×2): 325 mg via ORAL
  Filled 2016-07-19 (×2): qty 1

## 2016-07-19 MED ORDER — ONDANSETRON HCL 4 MG/2ML IJ SOLN
INTRAMUSCULAR | Status: AC
  Filled 2016-07-19: qty 2

## 2016-07-19 MED ORDER — PRENATAL MULTIVITAMIN CH
1.0000 | ORAL_TABLET | Freq: Every day | ORAL | Status: DC
  Administered 2016-07-20 – 2016-07-21 (×2): 1 via ORAL
  Filled 2016-07-19 (×2): qty 1

## 2016-07-19 MED ORDER — FENTANYL CITRATE (PF) 100 MCG/2ML IJ SOLN: 25.0000 ug | INTRAMUSCULAR | Status: DC | PRN

## 2016-07-19 MED ORDER — BUPIVACAINE IN DEXTROSE 0.75-8.25 % IT SOLN
INTRATHECAL | Status: DC | PRN
  Administered 2016-07-19: 1.3 mL via INTRATHECAL

## 2016-07-19 MED ORDER — ZOLPIDEM TARTRATE 5 MG PO TABS: 5.0000 mg | ORAL_TABLET | Freq: Every evening | ORAL | Status: DC | PRN

## 2016-07-19 MED ORDER — IBUPROFEN 600 MG PO TABS
600.0000 mg | ORAL_TABLET | Freq: Four times a day (QID) | ORAL | Status: DC
  Administered 2016-07-19 – 2016-07-21 (×7): 600 mg via ORAL
  Filled 2016-07-19 (×8): qty 1

## 2016-07-19 MED ORDER — FLEET ENEMA 7-19 GM/118ML RE ENEM: 1.0000 | ENEMA | Freq: Every day | RECTAL | Status: DC | PRN

## 2016-07-19 MED ORDER — DIPHENHYDRAMINE HCL 25 MG PO CAPS
25.0000 mg | ORAL_CAPSULE | ORAL | Status: DC | PRN
  Filled 2016-07-19: qty 1

## 2016-07-19 MED ORDER — NALOXONE HCL 2 MG/2ML IJ SOSY
1.0000 ug/kg/h | PREFILLED_SYRINGE | INTRAMUSCULAR | Status: DC | PRN
  Filled 2016-07-19: qty 2

## 2016-07-19 MED ORDER — SODIUM CHLORIDE 0.9% FLUSH: 3.0000 mL | INTRAVENOUS | Status: DC | PRN

## 2016-07-19 MED ORDER — MORPHINE SULFATE (PF) 0.5 MG/ML IJ SOLN
INTRAMUSCULAR | Status: AC
  Filled 2016-07-19: qty 10

## 2016-07-19 MED ORDER — PHENYLEPHRINE 8 MG IN D5W 100 ML (0.08MG/ML) PREMIX OPTIME
INJECTION | INTRAVENOUS | Status: DC | PRN
  Administered 2016-07-19: 60 ug/min via INTRAVENOUS

## 2016-07-19 MED ORDER — DIBUCAINE 1 % RE OINT: 1.0000 "application " | TOPICAL_OINTMENT | RECTAL | Status: DC | PRN

## 2016-07-19 MED ORDER — SIMETHICONE 80 MG PO CHEW
80.0000 mg | CHEWABLE_TABLET | Freq: Three times a day (TID) | ORAL | Status: DC
  Administered 2016-07-20 – 2016-07-21 (×3): 80 mg via ORAL
  Filled 2016-07-19 (×8): qty 1

## 2016-07-19 MED ORDER — WITCH HAZEL-GLYCERIN EX PADS: 1.0000 "application " | MEDICATED_PAD | CUTANEOUS | Status: DC | PRN

## 2016-07-19 MED ORDER — FAMOTIDINE 20 MG PO TABS
10.0000 mg | ORAL_TABLET | Freq: Two times a day (BID) | ORAL | Status: DC
  Administered 2016-07-19 – 2016-07-20 (×2): 10 mg via ORAL
  Administered 2016-07-21: 13:00:00 via ORAL
  Filled 2016-07-19 (×7): qty 1

## 2016-07-19 MED ORDER — NALBUPHINE HCL 10 MG/ML IJ SOLN
5.0000 mg | INTRAMUSCULAR | Status: DC | PRN
  Administered 2016-07-19 (×2): 5 mg via SUBCUTANEOUS
  Filled 2016-07-19: qty 1

## 2016-07-19 MED ORDER — OXYCODONE HCL 5 MG PO TABS: 10.0000 mg | ORAL_TABLET | ORAL | Status: DC | PRN

## 2016-07-19 MED ORDER — COCONUT OIL OIL
1.0000 "application " | TOPICAL_OIL | Status: DC | PRN
  Administered 2016-07-21: 1 via TOPICAL
  Filled 2016-07-19: qty 120

## 2016-07-19 MED ORDER — PHENYLEPHRINE 40 MCG/ML (10ML) SYRINGE FOR IV PUSH (FOR BLOOD PRESSURE SUPPORT)
PREFILLED_SYRINGE | INTRAVENOUS | Status: AC
  Filled 2016-07-19: qty 10

## 2016-07-19 MED ORDER — BISACODYL 10 MG RE SUPP
10.0000 mg | Freq: Every day | RECTAL | Status: DC | PRN
  Filled 2016-07-19: qty 1

## 2016-07-19 MED ORDER — SIMETHICONE 80 MG PO CHEW: 80.0000 mg | CHEWABLE_TABLET | ORAL | Status: DC | PRN

## 2016-07-19 MED ORDER — ACETAMINOPHEN 325 MG PO TABS
650.0000 mg | ORAL_TABLET | ORAL | Status: DC | PRN
  Administered 2016-07-20 – 2016-07-21 (×3): 650 mg via ORAL
  Filled 2016-07-19 (×3): qty 2

## 2016-07-19 MED ORDER — MEPERIDINE HCL 25 MG/ML IJ SOLN: 6.2500 mg | INTRAMUSCULAR | Status: DC | PRN

## 2016-07-19 MED ORDER — OXYTOCIN 10 UNIT/ML IJ SOLN
INTRAMUSCULAR | Status: AC
  Filled 2016-07-19: qty 4

## 2016-07-19 MED ORDER — METHYLERGONOVINE MALEATE 0.2 MG PO TABS: 0.2000 mg | ORAL_TABLET | ORAL | Status: DC | PRN

## 2016-07-19 MED ORDER — DIPHENHYDRAMINE HCL 25 MG PO CAPS
25.0000 mg | ORAL_CAPSULE | Freq: Four times a day (QID) | ORAL | Status: DC | PRN
  Filled 2016-07-19: qty 1

## 2016-07-19 MED ORDER — SOD CITRATE-CITRIC ACID 500-334 MG/5ML PO SOLN
30.0000 mL | Freq: Once | ORAL | Status: AC
  Administered 2016-07-19: 30 mL via ORAL
  Filled 2016-07-19: qty 15

## 2016-07-19 MED ORDER — TETANUS-DIPHTH-ACELL PERTUSSIS 5-2.5-18.5 LF-MCG/0.5 IM SUSP
0.5000 mL | Freq: Once | INTRAMUSCULAR | Status: DC
  Filled 2016-07-19: qty 0.5

## 2016-07-19 MED ORDER — NALBUPHINE HCL 10 MG/ML IJ SOLN: 5.0000 mg | INTRAMUSCULAR | Status: DC | PRN

## 2016-07-19 MED ORDER — PHENYLEPHRINE 8 MG IN D5W 100 ML (0.08MG/ML) PREMIX OPTIME
INJECTION | INTRAVENOUS | Status: AC
  Filled 2016-07-19: qty 100

## 2016-07-19 MED ORDER — FENTANYL CITRATE (PF) 100 MCG/2ML IJ SOLN
INTRAMUSCULAR | Status: DC | PRN
  Administered 2016-07-19: 20 ug via INTRAVENOUS

## 2016-07-19 MED ORDER — NALOXONE HCL 0.4 MG/ML IJ SOLN: 0.4000 mg | INTRAMUSCULAR | Status: DC | PRN

## 2016-07-19 MED ORDER — METOCLOPRAMIDE HCL 5 MG/ML IJ SOLN: 10.0000 mg | Freq: Once | INTRAMUSCULAR | Status: DC | PRN

## 2016-07-19 MED ORDER — MENTHOL 3 MG MT LOZG: 1.0000 | LOZENGE | OROMUCOSAL | Status: DC | PRN

## 2016-07-19 MED ORDER — OXYCODONE HCL 5 MG PO TABS
5.0000 mg | ORAL_TABLET | ORAL | Status: DC | PRN
  Administered 2016-07-21: 5 mg via ORAL
  Filled 2016-07-19: qty 1

## 2016-07-19 MED ORDER — PHENYLEPHRINE HCL 10 MG/ML IJ SOLN
INTRAMUSCULAR | Status: DC | PRN
  Administered 2016-07-19: 40 ug via INTRAVENOUS
  Administered 2016-07-19: 100 ug via INTRAVENOUS

## 2016-07-19 MED ORDER — LACTATED RINGERS IV SOLN
INTRAVENOUS | Status: DC | PRN
  Administered 2016-07-19: 12:00:00 via INTRAVENOUS

## 2016-07-19 MED ORDER — MORPHINE SULFATE (PF) 0.5 MG/ML IJ SOLN
INTRAMUSCULAR | Status: DC | PRN
  Administered 2016-07-19: .2 mg via EPIDURAL

## 2016-07-19 MED ORDER — NALBUPHINE HCL 10 MG/ML IJ SOLN
5.0000 mg | Freq: Once | INTRAMUSCULAR | Status: AC | PRN
  Administered 2016-07-19: 5 mg via SUBCUTANEOUS

## 2016-07-19 MED ORDER — OXYTOCIN 10 UNIT/ML IJ SOLN
INTRAVENOUS | Status: DC | PRN
  Administered 2016-07-19: 40 [IU] via INTRAVENOUS

## 2016-07-19 MED ORDER — EPHEDRINE SULFATE 50 MG/ML IJ SOLN
INTRAMUSCULAR | Status: DC | PRN
  Administered 2016-07-19 (×2): 10 mg via INTRAVENOUS

## 2016-07-19 MED ORDER — METHYLERGONOVINE MALEATE 0.2 MG/ML IJ SOLN: 0.2000 mg | INTRAMUSCULAR | Status: DC | PRN

## 2016-07-19 MED ORDER — DIPHENHYDRAMINE HCL 50 MG/ML IJ SOLN: 12.5000 mg | INTRAMUSCULAR | Status: DC | PRN

## 2016-07-19 MED ORDER — LACTATED RINGERS IV SOLN
INTRAVENOUS | Status: DC
  Administered 2016-07-19: 22:00:00 via INTRAVENOUS

## 2016-07-19 MED ORDER — BUPIVACAINE IN DEXTROSE 0.75-8.25 % IT SOLN
INTRATHECAL | Status: AC
  Filled 2016-07-19: qty 2

## 2016-07-19 MED ORDER — ONDANSETRON HCL 4 MG/2ML IJ SOLN: 4.0000 mg | Freq: Three times a day (TID) | INTRAMUSCULAR | Status: DC | PRN

## 2016-07-19 MED ORDER — LACTATED RINGERS IV SOLN
INTRAVENOUS | Status: DC
  Administered 2016-07-19 (×3): via INTRAVENOUS

## 2016-07-19 MED ORDER — CEFAZOLIN SODIUM-DEXTROSE 2-4 GM/100ML-% IV SOLN
2.0000 g | INTRAVENOUS | Status: AC
  Administered 2016-07-19: 2 g via INTRAVENOUS
  Filled 2016-07-19: qty 100

## 2016-07-19 MED ORDER — OXYTOCIN 40 UNITS IN LACTATED RINGERS INFUSION - SIMPLE MED
2.5000 [IU]/h | INTRAVENOUS | Status: AC
Start: 2016-07-19 — End: 2016-07-20

## 2016-07-19 MED ORDER — SCOPOLAMINE 1 MG/3DAYS TD PT72
1.0000 | MEDICATED_PATCH | TRANSDERMAL | Status: DC
  Administered 2016-07-19: 1.5 mg via TRANSDERMAL
  Filled 2016-07-19: qty 1

## 2016-07-19 MED ORDER — ONDANSETRON HCL 4 MG/2ML IJ SOLN
INTRAMUSCULAR | Status: DC | PRN
  Administered 2016-07-19: 4 mg via INTRAVENOUS

## 2016-07-19 MED ORDER — SIMETHICONE 80 MG PO CHEW
80.0000 mg | CHEWABLE_TABLET | ORAL | Status: DC
  Administered 2016-07-19 – 2016-07-20 (×2): 80 mg via ORAL
  Filled 2016-07-19 (×2): qty 1

## 2016-07-19 MED ORDER — NALBUPHINE HCL 10 MG/ML IJ SOLN
INTRAMUSCULAR | Status: AC
  Administered 2016-07-19: 5 mg via SUBCUTANEOUS
  Filled 2016-07-19: qty 1

## 2016-07-19 MED ORDER — MEASLES, MUMPS & RUBELLA VAC ~~LOC~~ INJ
0.5000 mL | INJECTION | Freq: Once | SUBCUTANEOUS | Status: DC
  Filled 2016-07-19: qty 0.5

## 2016-07-19 SURGICAL SUPPLY — 35 items
BENZOIN TINCTURE PRP APPL 2/3 (GAUZE/BANDAGES/DRESSINGS) ×3 IMPLANT
CLAMP CORD UMBIL (MISCELLANEOUS) IMPLANT
CLIP FILSHIE TUBAL LIGA STRL (Clip) ×3 IMPLANT
CLOSURE WOUND 1/2 X4 (GAUZE/BANDAGES/DRESSINGS) ×1
CLOTH BEACON ORANGE TIMEOUT ST (SAFETY) ×3 IMPLANT
DRSG OPSITE POSTOP 4X10 (GAUZE/BANDAGES/DRESSINGS) ×3 IMPLANT
DURAPREP 26ML APPLICATOR (WOUND CARE) ×3 IMPLANT
ELECT REM PT RETURN 9FT ADLT (ELECTROSURGICAL) ×3
ELECTRODE REM PT RTRN 9FT ADLT (ELECTROSURGICAL) ×1 IMPLANT
EXTRACTOR VACUUM BELL STYLE (SUCTIONS) IMPLANT
GLOVE BIO SURGEON STRL SZ7 (GLOVE) ×3 IMPLANT
GLOVE BIOGEL PI IND STRL 7.0 (GLOVE) ×1 IMPLANT
GLOVE BIOGEL PI INDICATOR 7.0 (GLOVE) ×2
GOWN STRL REUS W/TWL LRG LVL3 (GOWN DISPOSABLE) ×6 IMPLANT
KIT ABG SYR 3ML LUER SLIP (SYRINGE) IMPLANT
NEEDLE HYPO 25X5/8 SAFETYGLIDE (NEEDLE) IMPLANT
NS IRRIG 1000ML POUR BTL (IV SOLUTION) ×3 IMPLANT
PACK C SECTION WH (CUSTOM PROCEDURE TRAY) ×3 IMPLANT
PAD ABD 8X10 STRL (GAUZE/BANDAGES/DRESSINGS) ×3 IMPLANT
PAD OB MATERNITY 4.3X12.25 (PERSONAL CARE ITEMS) ×3 IMPLANT
PENCIL SMOKE EVAC W/HOLSTER (ELECTROSURGICAL) ×3 IMPLANT
RTRCTR C-SECT PINK 25CM LRG (MISCELLANEOUS) ×3 IMPLANT
SPONGE GAUZE 4X4 FOR O.R. (GAUZE/BANDAGES/DRESSINGS) ×6 IMPLANT
STRIP CLOSURE SKIN 1/2X4 (GAUZE/BANDAGES/DRESSINGS) ×2 IMPLANT
SUT MNCRL 0 VIOLET CTX 36 (SUTURE) ×2 IMPLANT
SUT MONOCRYL 0 CTX 36 (SUTURE) ×4
SUT PDS AB 0 CTX 60 (SUTURE) IMPLANT
SUT PLAIN 2 0 XLH (SUTURE) ×3 IMPLANT
SUT VIC AB 0 CT1 27 (SUTURE) ×4
SUT VIC AB 0 CT1 27XBRD ANBCTR (SUTURE) ×2 IMPLANT
SUT VIC AB 2-0 CT1 27 (SUTURE) ×2
SUT VIC AB 2-0 CT1 TAPERPNT 27 (SUTURE) ×1 IMPLANT
SUT VIC AB 4-0 KS 27 (SUTURE) ×3 IMPLANT
TOWEL OR 17X24 6PK STRL BLUE (TOWEL DISPOSABLE) ×3 IMPLANT
TRAY FOLEY BAG SILVER LF 14FR (SET/KITS/TRAYS/PACK) ×3 IMPLANT

## 2016-07-19 NOTE — Progress Notes (Signed)
Interpreter at bedside for communication.

## 2016-07-19 NOTE — Anesthesia Postprocedure Evaluation (Signed)
Anesthesia Post Note  Patient: Erika Keith  Procedure(s) Performed: Procedure(s) (LRB): REPEAT CESAREAN SECTION WITH BILATERAL TUBAL LIGATION (Bilateral)  Patient location during evaluation: Mother Baby Anesthesia Type: Spinal Level of consciousness: awake and alert and oriented Pain management: satisfactory to patient Vital Signs Assessment: post-procedure vital signs reviewed and stable Respiratory status: spontaneous breathing and nonlabored ventilation Cardiovascular status: stable Postop Assessment: no headache, no backache, patient able to bend at knees, no signs of nausea or vomiting and adequate PO intake Anesthetic complications: no        Last Vitals:  Vitals:   07/19/16 1700 07/19/16 1800  BP: (!) 100/41 (!) 99/51  Pulse: 93 93  Resp: 16 16  Temp:  37.1 C    Last Pain:  Vitals:   07/19/16 1700  TempSrc:   PainSc: Asleep   Pain Goal:                 Madison Hickman

## 2016-07-19 NOTE — Anesthesia Preprocedure Evaluation (Signed)
Anesthesia Evaluation  Patient identified by MRN, date of birth, ID band Patient awake    Reviewed: Allergy & Precautions, NPO status , Patient's Chart, lab work & pertinent test results  Airway Mallampati: II  TM Distance: >3 FB Neck ROM: Full    Dental no notable dental hx. (+) Teeth Intact   Pulmonary    Pulmonary exam normal breath sounds clear to auscultation       Cardiovascular negative cardio ROS Normal cardiovascular exam Rhythm:Regular Rate:Normal     Neuro/Psych PSYCHIATRIC DISORDERS Depression negative neurological ROS     GI/Hepatic Neg liver ROS, GERD  Medicated and Controlled,  Endo/Other  diabetes, Well Controlled, GestationalDiet controlled gestational DM Obesity  Renal/GU negative Renal ROS  negative genitourinary   Musculoskeletal negative musculoskeletal ROS (+)   Abdominal (+) + obese,   Peds  Hematology Thrombocytopenia-mild   Anesthesia Other Findings   Reproductive/Obstetrics (+) Pregnancy Previous C/section Desires sterilization                             Anesthesia Physical Anesthesia Plan  ASA: II  Anesthesia Plan: Spinal   Post-op Pain Management:    Induction:   Airway Management Planned: Natural Airway  Additional Equipment:   Intra-op Plan:   Post-operative Plan:   Informed Consent: I have reviewed the patients History and Physical, chart, labs and discussed the procedure including the risks, benefits and alternatives for the proposed anesthesia with the patient or authorized representative who has indicated his/her understanding and acceptance.   Dental advisory given  Plan Discussed with: Anesthesiologist, CRNA and Surgeon  Anesthesia Plan Comments:         Anesthesia Quick Evaluation

## 2016-07-19 NOTE — Progress Notes (Signed)
Consent obtained for repeat C/S and BTL via interpreter.

## 2016-07-19 NOTE — Brief Op Note (Signed)
07/19/2016  11:41 AM  PATIENT:  Erika Keith  31 y.o. female  PRE-OPERATIVE DIAGNOSIS:  REPEAT EDD: 07/23/16 NKDA  POST-OPERATIVE DIAGNOSIS:  * No post-op diagnosis entered *  PROCEDURE:  Procedure(s): REPEAT CESAREAN SECTION WITH BILATERAL TUBAL LIGATION (Bilateral)  SURGEON:  Surgeon(s) and Role:    * Aaria Happ, MD - Primary  ASSISTANTS: Dr. Clark.   ANESTHESIA:   spinal  EBL:  No intake/output data recorded.  LOCAL MEDICATIONS USED:  NONE  SPECIMEN:  No Specimen  DISPOSITION OF SPECIMEN:  N/A  COUNTS:  YES  TOURNIQUET:  * No tourniquets in log *  DICTATION: .Note written in EPIC  PLAN OF CARE: Admit to inpatient   PATIENT DISPOSITION:  PACU - hemodynamically stable.   Delay start of Pharmacological VTE agent (>24hrs) due to surgical blood loss or risk of bleeding: not applicable  Complications:  none Medications:  Ancef, Pitocin Findings:  Baby female, Apgars 9,9, weight 8#13.   Normal tubes, ovaries and uterus seen.  Baby was skin to skin with mother after birth in the OR.  Technique:  After adequate spinal anesthesia was achieved, the patient was prepped and draped in usual sterile fashion.  A foley catheter was used to drain the bladder.  A pfannanstiel incision was made with the scalpel and carried down to the fascia with the bovie cautery. The fascia was incised in the midline with the scalpel and carried in a transverse curvilinear manner bilaterally.  The fascia was reflected superiorly and inferiorly off the rectus muscles and the muscles split in the midline.  A bowel free portion of the peritoneum was entered bluntly and then extended in a superior and inferior manner with good visualization of the bowel and bladder.  The Alexis instrument was then placed and the vesico-uterine fascia tented up and incised in a transverse curvilinear manner.  A 2 cm transverse incision was made in the upper portion of the lower uterine segment until the amnion was  exposed.   The incision was extended transversely in a blunt manner.  Clear fluid was noted and the baby delivered in the vertex presentation without complication.  The baby was bulb suctioned and the cord was clamped and cut.  The baby was then handed to awaiting Neonatology.  The placenta was then delivered manually and the uterus cleared of all debris.  The uterine incision was then closed with a running lock stitch of 0 monocryl.  An imbricating layer of 0 monocryl was closed as well. Excellent hemostasis of the uterine incision was achieved and the abdomen was cleared with irrigation.  The tubes were followed to their fimbriated ends and an isthmic portion of each was grasped with a babcock.  The filschie clips were each placed incorporating the entire tube. The peritoneum was closed with a running stitch of 2-0 vicryl.  This incorporated the rectus muscles as a separate layer.  The fascia was then closed with a running stitch of 0 vicryl.  The subcutaneous layer was closed with interrupted  stitches of 2-0 plain gut.  The skin was closed with 4-0 vicryl on a Keith needle and steri-strips.  The patient tolerated the procedure well and was returned to the recovery room in stable condition.  All counts were correct times three.  [name] A   

## 2016-07-19 NOTE — Progress Notes (Signed)
There has been no change in the patients history, status or exam since the history and physical. D/w pt with interpreter- desires to proceed and still desires BTL. Vitals:   07/19/16 1049  BP: 112/78  Pulse: (!) 102  Resp: 16  Temp: 98 F (36.7 C)  TempSrc: Oral  Weight: 155 lb (70.3 kg)  Height: 5' (1.524 m)    Lab Results  Component Value Date   WBC 8.1 07/18/2016   HGB 12.3 07/18/2016   HCT 38.7 07/18/2016   MCV 82.3 07/18/2016   PLT 130 (L) 07/18/2016    Leticia Coletta A

## 2016-07-19 NOTE — Transfer of Care (Signed)
Immediate Anesthesia Transfer of Care Note  Patient: Erika Keith  Procedure(s) Performed: Procedure(s): REPEAT CESAREAN SECTION WITH BILATERAL TUBAL LIGATION (Bilateral)  Patient Location: PACU  Anesthesia Type:Spinal  Level of Consciousness: awake, alert , oriented and patient cooperative  Airway & Oxygen Therapy: Patient Spontanous Breathing  Post-op Assessment: Report given to RN and Post -op Vital signs reviewed and stable  Post vital signs: Reviewed and stable  Last Vitals:  Vitals:   07/19/16 1049 07/19/16 1254  BP: 112/78 113/61  Pulse: (!) 102   Resp: 16   Temp: 36.7 C     Last Pain:  Vitals:   07/19/16 1049  TempSrc: Oral         Complications: No apparent anesthesia complications

## 2016-07-19 NOTE — Anesthesia Procedure Notes (Signed)
Spinal  Patient location during procedure: OR Start time: 07/19/2016 12:00 PM Staffing Anesthesiologist: Mal Amabile Performed: anesthesiologist  Preanesthetic Checklist Completed: patient identified, site marked, surgical consent, pre-op evaluation, timeout performed, IV checked, risks and benefits discussed and monitors and equipment checked Spinal Block Patient position: sitting Prep: site prepped and draped and DuraPrep Patient monitoring: heart rate, cardiac monitor, continuous pulse ox and blood pressure Approach: midline Location: L3-4 Injection technique: single-shot Needle Needle type: Sprotte  Needle gauge: 24 G Needle length: 9 cm Needle insertion depth: 5 cm Assessment Sensory level: T4 Additional Notes Patient tolerated procedure well. Adequate sensory level.

## 2016-07-19 NOTE — Anesthesia Postprocedure Evaluation (Signed)
Anesthesia Post Note  Patient: Erika Keith  Procedure(s) Performed: Procedure(s) (LRB): REPEAT CESAREAN SECTION WITH BILATERAL TUBAL LIGATION (Bilateral)  Patient location during evaluation: PACU Anesthesia Type: Spinal Level of consciousness: oriented and awake and alert Pain management: pain level controlled Vital Signs Assessment: post-procedure vital signs reviewed and stable Respiratory status: spontaneous breathing, respiratory function stable and nonlabored ventilation Cardiovascular status: blood pressure returned to baseline and stable Postop Assessment: no headache, no backache, spinal receding, patient able to bend at knees and no signs of nausea or vomiting Anesthetic complications: no        Last Vitals:  Vitals:   07/19/16 1254 07/19/16 1330  BP: 113/61 121/84  Pulse: (!) 108 96  Resp: (!) 22 (!) 21  Temp: 36.1 C     Last Pain:  Vitals:   07/19/16 1254  TempSrc: Oral   Pain Goal:                 Erika Keith A.

## 2016-07-19 NOTE — Op Note (Signed)
07/19/2016  11:41 AM  PATIENT:  Erika Keith  31 y.o. female  PRE-OPERATIVE DIAGNOSIS:  REPEAT EDD: 07/23/16 NKDA  POST-OPERATIVE DIAGNOSIS:  * No post-op diagnosis entered *  PROCEDURE:  Procedure(s): REPEAT CESAREAN SECTION WITH BILATERAL TUBAL LIGATION (Bilateral)  SURGEON:  Surgeon(s) and Role:    * Carrington Clamp, MD - Primary  ASSISTANTS: Dr. Chestine Spore.   ANESTHESIA:   spinal  EBL:  No intake/output data recorded.  LOCAL MEDICATIONS USED:  NONE  SPECIMEN:  No Specimen  DISPOSITION OF SPECIMEN:  N/A  COUNTS:  YES  TOURNIQUET:  * No tourniquets in log *  DICTATION: .Note written in EPIC  PLAN OF CARE: Admit to inpatient   PATIENT DISPOSITION:  PACU - hemodynamically stable.   Delay start of Pharmacological VTE agent (>24hrs) due to surgical blood loss or risk of bleeding: not applicable  Complications:  none Medications:  Ancef, Pitocin Findings:  Baby female, Apgars 9,9, weight 8#13.   Normal tubes, ovaries and uterus seen.  Baby was skin to skin with mother after birth in the OR.  Technique:  After adequate spinal anesthesia was achieved, the patient was prepped and draped in usual sterile fashion.  A foley catheter was used to drain the bladder.  A pfannanstiel incision was made with the scalpel and carried down to the fascia with the bovie cautery. The fascia was incised in the midline with the scalpel and carried in a transverse curvilinear manner bilaterally.  The fascia was reflected superiorly and inferiorly off the rectus muscles and the muscles split in the midline.  A bowel free portion of the peritoneum was entered bluntly and then extended in a superior and inferior manner with good visualization of the bowel and bladder.  The Alexis instrument was then placed and the vesico-uterine fascia tented up and incised in a transverse curvilinear manner.  A 2 cm transverse incision was made in the upper portion of the lower uterine segment until the amnion was  exposed.   The incision was extended transversely in a blunt manner.  Clear fluid was noted and the baby delivered in the vertex presentation without complication.  The baby was bulb suctioned and the cord was clamped and cut.  The baby was then handed to awaiting Neonatology.  The placenta was then delivered manually and the uterus cleared of all debris.  The uterine incision was then closed with a running lock stitch of 0 monocryl.  An imbricating layer of 0 monocryl was closed as well. Excellent hemostasis of the uterine incision was achieved and the abdomen was cleared with irrigation.  The tubes were followed to their fimbriated ends and an isthmic portion of each was grasped with a babcock.  The filschie clips were each placed incorporating the entire tube. The peritoneum was closed with a running stitch of 2-0 vicryl.  This incorporated the rectus muscles as a separate layer.  The fascia was then closed with a running stitch of 0 vicryl.  The subcutaneous layer was closed with interrupted  stitches of 2-0 plain gut.  The skin was closed with 4-0 vicryl on a Keith needle and steri-strips.  The patient tolerated the procedure well and was returned to the recovery room in stable condition.  All counts were correct times three.  Caylin Nass A

## 2016-07-19 NOTE — Progress Notes (Signed)
Pt breastfeeding well.  Good latch.  Audible suck and swallow.

## 2016-07-19 NOTE — Addendum Note (Signed)
Addendum  created 07/19/16 1809 by Shanon Payor, CRNA   Sign clinical note

## 2016-07-20 ENCOUNTER — Encounter (HOSPITAL_COMMUNITY): Payer: Self-pay | Admitting: Obstetrics and Gynecology

## 2016-07-20 LAB — CBC
HEMATOCRIT: 30.9 % — AB (ref 36.0–46.0)
HEMOGLOBIN: 10.2 g/dL — AB (ref 12.0–15.0)
MCH: 27.2 pg (ref 26.0–34.0)
MCHC: 33 g/dL (ref 30.0–36.0)
MCV: 82.4 fL (ref 78.0–100.0)
Platelets: 104 10*3/uL — ABNORMAL LOW (ref 150–400)
RBC: 3.75 MIL/uL — ABNORMAL LOW (ref 3.87–5.11)
RDW: 16.4 % — AB (ref 11.5–15.5)
WBC: 9 10*3/uL (ref 4.0–10.5)

## 2016-07-20 NOTE — Lactation Note (Signed)
This note was copied from a baby's chart. Lactation Consultation Note  Patient Name: Erika Keith [id number] Date: 07/20/2016 Reason for consult: Initial assessment Baby at 22 hr of life. Mom reports baby is latching well but she is having bilateral nipple pain. The R nipple appears slightly bruised but the skin is in tact. The L nipple has a scab covering 95% of the nipple surface. Made mom a bra from mesh panties because she did not bring a bra. Applied comfort gels. While in the room talking with mom the baby appeared jittery. Spoon fed 5 ml of expressed colostrum and report given to RN. Baby tolerated spoon feeding well. Baby has a noticeable lingual frenulum. Baby can lift tongue to roof, extend tongue over gum ridge, and has a nice gape. Discussed baby behavior, feeding frequency, baby belly size, voids, wt loss, breast changes, and nipple care. Given lactation handouts. Aware of OP services and support group.     Maternal Data Has patient been taught Hand Expression?: Yes  Feeding Feeding Type: Breast Fed  LATCH Score/Interventions Latch: Grasps breast easily, tongue down, lips flanged, rhythmical sucking.  Audible Swallowing: A few with stimulation  Type of Nipple: Everted at rest and after stimulation  Comfort (Breast/Nipple): Filling, red/small blisters or bruises, mild/mod discomfort  Problem noted: Mild/Moderate discomfort  Hold (Positioning): Assistance needed to correctly position infant at breast and maintain latch.  LATCH Score: 7  Lactation Tools Discussed/Used WIC Program: No (given information) Pump Review: Setup, frequency, and cleaning;Milk Storage Initiated by:: ES Date initiated:: 07/20/16   Consult Status Consult Status: Follow-up Date: 07/21/16 Follow-up type: In-patient    Erika Keith 07/20/2016, 11:10 AM

## 2016-07-20 NOTE — Progress Notes (Signed)
Patient ID: Erika Keith, female   DOB: 05-Aug-1985, 31 y.o.   MRN: [id number]  Patient is eating, ambulating, voiding.  Pain control is good.  Vitals:   07/19/16 1800 07/19/16 2200 07/20/16 0200 07/20/16 0609  BP: (!) 99/51 106/62 (!) 105/47 (!) 93/59  Pulse: 93 95 87 91  Resp: Temp: 98.7 F (37.1 C) 98.6 F (37 C)  97.5 F (36.4 C)  TempSrc:  Oral  Oral  SpO2: 97% 98% 97% 98%  Weight:      Height:        lungs:   clear to auscultation cor:    RRR Abdomen:  soft, appropriate tenderness, incisions intact and without erythema or exudate ex:    no cords   Lab Results  Component Value Date   WBC 9.0 07/20/2016   HGB 10.2 (L) 07/20/2016   HCT 30.9 (L) 07/20/2016   MCV 82.4 07/20/2016   PLT 104 (L) 07/20/2016    --/--/A POS (04/24 0930)/RI  A/P    Post operative day 1.  Routine post op and postpartum care.  Expect d/c routine.  Percocet for pain control.

## 2016-07-20 NOTE — Progress Notes (Signed)
CSW received consult for hx of Depression.  Upon chart review, CSW does not note any mental health concerns documented in [facility].  CSW met with MOB to offer support and provide education regarding PMADs.  CSW utilized a Engineer, site to communicate with MOB.  MOB was quiet, but pleasant and smiled at [place].  She was breast feeding infant and appeared calm and relaxed.  She denies any hx of Depression and states she is feeling well now.  She states no emotional concerns after her first child other than feeling "tired."  She reports that her husband is a good support as is her sister-in-law.  She states she has everything she needs for baby at home.  She was not aware of SIDS, but states understanding.  She was attentive to information given by CSW.  CSW identifies no further need for intervention or barriers to discharge.

## 2016-07-21 MED ORDER — OXYCODONE HCL 5 MG PO TABS: 5.0000 mg | ORAL_TABLET | ORAL | 0 refills | Status: AC | PRN

## 2016-07-21 NOTE — Lactation Note (Signed)
This note was copied from a baby's chart. Lactation Consultation Note  Patient Name: Erika Keith [id number] Date: 07/21/2016 Reason for consult: Follow-up assessment;Pump rental  Follow-up consult on day of discharge at 48 hrs for a [place] couple.  Dexter used for interpreting [employer] [phone] & Mai [phone]) GA 39.3; Bw 8 lbs, 13.6 oz.   Infant has breastfed x8 (10 min each) + formula via bottle x4 (10-35 ml) + EBM via spoon x2 (drops-46ml); voids-5 in 24 hrs/ 7 life; stools-2 in 24 hrs/ 5 life.  Mom needed to rent pump for 2 weeks while she awaits pump from insurance company.  Rented pump, taught how to set up pump with return demonstration from patient.   Infant began to awaken and show cues for feeding.  LC instructed mom to latch infant and feed while LC left room to process rental payment Upon returning, mom was sitting on couch leaned over infant laying on pillow with infant latched to tip of nipple on left breast.   LC taught mom how to use cross-cradle hold with breast support and encouraged mom to lead in with chin first.   Mom had difficulty with teaching d/t language barrier so it took several minutes to communicate so pt could understand.   When infant cried LC noted tight sublingual frenulum.  LC did not discuss with parents since latching with good support and depth was a greater teaching concern at this time.   LC discussed with mom need for wide mouth taking in majority of areola and flanged lips to minimize nipple pain.  Mom previously given comfort gels noted in chart.   Mom verbalized understanding via interpreter and was asking for extra formula to take home.   LC assured parents understood when, where, and how to return pump in 2 weeks.     Maternal Data    Feeding Feeding Type: Breast Fed Length of feed: 10 min  LATCH Score/Interventions Latch: Repeated attempts needed to sustain latch, nipple held in mouth throughout feeding, stimulation needed to  elicit sucking reflex.  Audible Swallowing: A few with stimulation  Type of Nipple: Everted at rest and after stimulation  Comfort (Breast/Nipple): Filling, red/small blisters or bruises, mild/mod discomfort  Problem noted: Filling;Mild/Moderate discomfort  Hold (Positioning): Assistance needed to correctly position infant at breast and maintain latch. Intervention(s): Breastfeeding basics reviewed;Support Pillows;Skin to skin  LATCH Score: 6  Lactation Tools Discussed/Used Pump Review: Setup, frequency, and cleaning   Consult Status Consult Status: Complete    Lendon Ka 07/21/2016, 2:11 PM

## 2016-07-21 NOTE — Discharge Summary (Signed)
Obstetric Discharge Summary Reason for Admission: cesarean section Prenatal Procedures: none Intrapartum Procedures: cesarean: low cervical, transverse with BTL Postpartum Procedures: none Complications-Operative and Postpartum: none Hemoglobin  Date Value Ref Range Status  07/20/2016 10.2 (L) 12.0 - 15.0 g/dL Final   HCT  Date Value Ref Range Status  07/20/2016 30.9 (L) 36.0 - 46.0 % Final    Physical Exam:  General: alert and cooperative Lochia: appropriate Uterine Fundus: firm Incision: healing well, no significant drainage DVT Evaluation: No evidence of DVT seen on physical exam.  Discharge Diagnoses: Term Pregnancy-delivered  Discharge Information: Date: 07/21/2016 Activity: pelvic rest Diet: routine Medications: PNV, Ibuprofen and Percocet Condition: stable Instructions: refer to practice specific booklet Discharge to: home Follow-up Information    HORVATH,MICHELLE A, MD Follow up in 2 day(s).   Specialty:  Obstetrics and Gynecology Contact information: [address] [place] [address] [phone]           Newborn Data: Live born female  Birth Weight: 8 lb 13.6 oz (4015 g) APGAR: 9, 9  Home with mother.  Philip Aspen 07/21/2016, 10:11 AM

## 2016-07-22 ENCOUNTER — Ambulatory Visit: Payer: Self-pay

## 2016-07-22 NOTE — Lactation Note (Signed)
This note was copied from a baby's chart. Lactation Consultation Note: Dexter used with interpreter Erika Keith. Mother reports that she doesn't have milk yet. Observed that mothers breast are very firm and full. She has bilateral positional strips. Mother reports that she  wants to pump and bottle feed infant. Mother has not pumped since yesterday.  Mother was sat up with DEBP and assistance given to pump. The left breast began to bleed and milk was discolored pink . Informed that this was from cracking and  milk ok to give to infant.  Mother was fit with the #27 flanges. Mothers breast became soft after pumping for 15 mins. Mother pumped approx 20 ml. Advised mother to pump every 2-3 hours for 15-20 mins.  Mother advised to give infant ebm with next feeding. Reviewed storage, collection, and cleaning of breast pump parts. Mother reports that she understands all teaching. Mother has comfort gels but not wearing them.  Advised mother not to let breast get real firm. Discussed treatment to prevent engorgement. Mother has sister in law that speaks english and can phone LC services if she has questions or concerns about pumping.   Patient Name: Erika Keith [id number] Date: 07/22/2016 Reason for consult: Follow-up assessment   Maternal Data    Feeding Feeding Type: Bottle Fed - Formula Nipple Type: Slow - flow  LATCH Score/Interventions                      Lactation Tools Discussed/Used     Consult Status      Michel Bickers 07/22/2016, 11:17 AM

## 2019-11-12 ENCOUNTER — Other Ambulatory Visit: Payer: Self-pay

## 2019-11-12 ENCOUNTER — Telehealth (HOSPITAL_COMMUNITY): Payer: BLUE CROSS/BLUE SHIELD | Admitting: Psychiatry

## 2019-11-14 ENCOUNTER — Telehealth (HOSPITAL_COMMUNITY): Payer: BLUE CROSS/BLUE SHIELD | Admitting: Psychiatry

## 2019-11-17 ENCOUNTER — Telehealth (HOSPITAL_COMMUNITY): Payer: BLUE CROSS/BLUE SHIELD | Admitting: Psychiatry

## 2019-11-27 ENCOUNTER — Ambulatory Visit (HOSPITAL_COMMUNITY): Payer: BLUE CROSS/BLUE SHIELD | Admitting: Licensed Clinical Social Worker

## 2019-11-27 ENCOUNTER — Telehealth (HOSPITAL_COMMUNITY): Payer: Self-pay | Admitting: Licensed Clinical Social Worker

## 2019-11-27 NOTE — Progress Notes (Signed)
See telephone note.

## 2019-11-27 NOTE — Telephone Encounter (Signed)
Therapist contacted patient who speaks [place]  Therapist informed after hung up to use function of language interpreter in [facility] [employer].  Therapist was unable to contact patient to restart session. Before hanging up with patient therapist used Phone app to communicate with patient that next session she and therapist will ensure interpreter set up.

## 2019-11-27 NOTE — Telephone Encounter (Signed)
Therapist connected with patient who said she did not speak [place] and her language was [place]  Discussed setting up translator for next session therapist used her phone app to communicate that.  After hung up found out therapist could use [facility] [employer] translator.  Attempted to reconnect with patient but she did not reconnect with therapist.  She had said she would reschedule with translator. Therapist will be able to use translator for next session.

## 2020-04-23 ENCOUNTER — Telehealth (HOSPITAL_BASED_OUTPATIENT_CLINIC_OR_DEPARTMENT_OTHER): Payer: Self-pay | Admitting: Emergency Medicine

## 2020-04-23 ENCOUNTER — Encounter (HOSPITAL_BASED_OUTPATIENT_CLINIC_OR_DEPARTMENT_OTHER): Payer: Self-pay | Admitting: *Deleted

## 2020-04-23 ENCOUNTER — Emergency Department (HOSPITAL_BASED_OUTPATIENT_CLINIC_OR_DEPARTMENT_OTHER)
Admission: EM | Admit: 2020-04-23 | Discharge: 2020-04-23 | Disposition: A | Discharge status: Home / Self Care | Payer: BLUE CROSS/BLUE SHIELD | Attending: Emergency Medicine | Admitting: Emergency Medicine

## 2020-04-23 ENCOUNTER — Emergency Department (HOSPITAL_BASED_OUTPATIENT_CLINIC_OR_DEPARTMENT_OTHER): Payer: BLUE CROSS/BLUE SHIELD

## 2020-04-23 ENCOUNTER — Other Ambulatory Visit: Payer: Self-pay

## 2020-04-23 DIAGNOSIS — S0990XA Unspecified injury of head, initial encounter: Secondary | ICD-10-CM | POA: Insufficient documentation

## 2020-04-23 DIAGNOSIS — U071 COVID-19: Secondary | ICD-10-CM | POA: Diagnosis not present

## 2020-04-23 DIAGNOSIS — W260XXA Contact with knife, initial encounter: Secondary | ICD-10-CM | POA: Diagnosis not present

## 2020-04-23 DIAGNOSIS — S61411A Laceration without foreign body of right hand, initial encounter: Secondary | ICD-10-CM | POA: Insufficient documentation

## 2020-04-23 DIAGNOSIS — S6991XA Unspecified injury of right wrist, hand and finger(s), initial encounter: Secondary | ICD-10-CM | POA: Diagnosis present

## 2020-04-23 MED ORDER — LIDOCAINE HCL (PF) 1 % IJ SOLN
5.0000 mL | Freq: Once | INTRAMUSCULAR | Status: AC
  Administered 2020-04-23: 5 mL
  Filled 2020-04-23: qty 5

## 2020-04-23 MED ORDER — CEPHALEXIN 500 MG PO CAPS: 500.0000 mg | ORAL_CAPSULE | Freq: Three times a day (TID) | ORAL | 0 refills | Status: AC

## 2020-04-23 MED ORDER — CEPHALEXIN 500 MG PO CAPS: 500.0000 mg | ORAL_CAPSULE | Freq: Three times a day (TID) | ORAL | 0 refills | Status: DC

## 2020-04-23 MED ORDER — ACETAMINOPHEN 325 MG PO TABS
650.0000 mg | ORAL_TABLET | Freq: Once | ORAL | Status: AC
  Administered 2020-04-23: 650 mg via ORAL
  Filled 2020-04-23: qty 2
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
## 2020-04-23 NOTE — ED Notes (Signed)
ED Provider at bedside. 
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
## 2020-04-23 NOTE — Discharge Instructions (Addendum)
Keep the wound dry for 48 hours.  You may then after 48 hours wash gently with soap and water.  Do not submerge the hand underwater for prolonged periods of time.  Keep the hand covered.  Follow-up with your hand specialist.  Call Monday to make an appointment within 7 to 10 days.  Return to the ER for suture removal in 7 to 10 days, or you may follow-up with a hand surgeon for suture removal.  Return immediately if you have fevers increased pain purulent drainage increased redness or any additional concerns.
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
## 2020-04-23 NOTE — ED Notes (Signed)
Pt requesting cab voucher home, cab voucher given to patient
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
## 2020-04-30 ENCOUNTER — Emergency Department (HOSPITAL_BASED_OUTPATIENT_CLINIC_OR_DEPARTMENT_OTHER)
Admission: EM | Admit: 2020-04-30 | Discharge: 2020-04-30 | Disposition: A | Discharge status: Home / Self Care | Payer: BLUE CROSS/BLUE SHIELD | Attending: Emergency Medicine | Admitting: Emergency Medicine

## 2020-04-30 ENCOUNTER — Encounter (HOSPITAL_BASED_OUTPATIENT_CLINIC_OR_DEPARTMENT_OTHER): Payer: Self-pay | Admitting: Emergency Medicine

## 2020-04-30 ENCOUNTER — Other Ambulatory Visit: Payer: Self-pay

## 2020-04-30 DIAGNOSIS — Z4802 Encounter for removal of sutures: Secondary | ICD-10-CM

## 2020-04-30 DIAGNOSIS — X58XXXD Exposure to other specified factors, subsequent encounter: Secondary | ICD-10-CM | POA: Diagnosis not present

## 2020-04-30 DIAGNOSIS — S61210D Laceration without foreign body of right index finger without damage to nail, subsequent encounter: Secondary | ICD-10-CM | POA: Insufficient documentation

## 2020-04-30 NOTE — Discharge Instructions (Addendum)
Please see attached information on suture removal after care.  You can apply bacitracin (neosporin) to the wound to help with healing. Keep wound clean and dry.  Return to the ED for any signs of infection including redness/swelling, drainage of pus, fevers > 100.4

## 2020-04-30 NOTE — ED Triage Notes (Signed)
Here to have sutures removed from right pointer finger.

## 2020-04-30 NOTE — ED Provider Notes (Signed)
[facility] EMERGENCY DEPARTMENT Provider Note   CSN: [id number] Arrival date & time: 04/30/20  1012     History Chief Complaint  Patient presents with  . Suture / Staple Removal    Erika Keith is a 35 y.o. female who presents to the ED today for suture removal. Pt was seen here on 1/28 for laceration to her right index finger on the radial aspect. She had 3 sutures placed and was told to return in 1 week for removal. She states that the wound has been doing well without any signs of infection. She has no other complaints at this time.   The history is provided by the patient and medical records.       Past Medical History:  Diagnosis Date  . Depression   . GERD (gastroesophageal reflux disease)   . Gestational diabetes    diet controlled    Patient Active Problem List   Diagnosis Date Noted  . Postoperative state 07/19/2016    Past Surgical History:  Procedure Laterality Date  . APPENDECTOMY    . CESAREAN SECTION    . CESAREAN SECTION WITH BILATERAL TUBAL LIGATION Bilateral 07/19/2016   Procedure: REPEAT CESAREAN SECTION WITH BILATERAL TUBAL LIGATION;  Surgeon: Carrington Clamp, MD;  Location: [facility] BIRTHING SUITES;  Service: Obstetrics;  Laterality: Bilateral;     OB History    Gravida  3   Para  2   Term  2   Preterm      AB  1   Living  2     SAB  1   IAB      Ectopic      Multiple  0   Live Births  2           Family History  Problem Relation Age of Onset  . Alcohol abuse Neg Hx   . Asthma Neg Hx   . Arthritis Neg Hx   . Birth defects Neg Hx   . Cancer Neg Hx   . COPD Neg Hx   . Depression Neg Hx   . Diabetes Neg Hx   . Drug abuse Neg Hx   . Early death Neg Hx   . Hearing loss Neg Hx   . Heart disease Neg Hx   . Hyperlipidemia Neg Hx   . Hypertension Neg Hx   . Kidney disease Neg Hx   . Learning disabilities Neg Hx   . Mental illness Neg Hx   . Mental retardation Neg Hx   . Miscarriages / Stillbirths Neg Hx   .  Stroke Neg Hx   . Vision loss Neg Hx   . Varicose Veins Neg Hx     Social History   Tobacco Use  . Smoking status: Never Smoker  . Smokeless tobacco: Never Used  Substance Use Topics  . Alcohol use: No  . Drug use: No    Home Medications Prior to Admission medications   Medication Sig Start Date End Date Taking? Authorizing Provider  oxyCODONE (OXY IR/ROXICODONE) 5 MG immediate release tablet Take 1 tablet (5 mg total) by mouth every 4 (four) hours as needed (pain scale 4-7). 07/21/16   Philip Aspen, DO  Prenatal Vit-Fe Fumarate-FA (PRENATAL VITAMIN PO) Take 1 tablet by mouth daily.     [provider]  ranitidine (ZANTAC) 150 MG tablet Take 1 tablet (150 mg total) by mouth 2 (two) times daily. 12/01/15   Judeth Horn, NP    Allergies    Zoloft [sertraline]  Review of Systems   Review of Systems  Constitutional: Negative for chills and fever.  Skin: Negative for wound.  All other systems reviewed and are negative.   Physical Exam Updated Vital Signs BP 105/67 (BP Location: Left Arm)   Pulse 86   Temp 98.1 F (36.7 C)   Resp 18   Ht 5\' 2"  (1.575 m)   Wt 54.4 kg   LMP 04/19/2020   SpO2 99%   BMI 21.94 kg/m   Physical Exam Vitals and nursing note reviewed.  Constitutional:      Appearance: She is not ill-appearing.  HENT:     Head: Normocephalic and atraumatic.  Eyes:     Conjunctiva/sclera: Conjunctivae normal.  Cardiovascular:     Rate and Rhythm: Normal rate and regular rhythm.  Pulmonary:     Effort: Pulmonary effort is normal.     Breath sounds: Normal breath sounds.  Musculoskeletal:     Comments: ROM intact to MCP, PIP, and DIP joint of right 2nd digit with cap refill < 2 seconds. No signs of infection to wound. No TTP. 2+ radial pulse.   Skin:    General: Skin is warm and dry.     Coloration: Skin is not jaundiced.     Comments: Well healed laceration to right index finger along radial aspect with sutures in place. No erythema, edema,  or drainage appreciated.   Neurological:     Mental Status: She is alert.     ED Results / Procedures / Treatments   Labs (all labs ordered are listed, but only abnormal results are displayed) Labs Reviewed - No data to display  EKG None  Radiology No results found.  Procedures .Suture Removal  Date/Time: 04/30/2020 10:37 AM Performed by: 06/28/2020, PA-C Authorized by: Tanda Rockers, PA-C   Consent:    Consent obtained:  Verbal   Consent given by:  Patient   Risks discussed:  Bleeding, pain and wound separation Location:    Location:  Upper extremity   Upper extremity location:  Hand   Hand location:  R index finger Procedure details:    Wound appearance:  No signs of infection   Number of sutures removed:  3 Post-procedure details:    Post-removal:  No dressing applied   Procedure completion:  Tolerated well, no immediate complications     Medications Ordered in ED Medications - No data to display  ED Course  I have reviewed the triage vital signs and the nursing notes.  Pertinent labs & imaging results that were available during my care of the patient were reviewed by me and considered in my medical decision making (see chart for details).    MDM Rules/Calculators/A&P                          35 year old female who presents to the ED for suture removal of laceration to her right index finger sustained 1 week ago.  Seen in the ED same day and had 3 sutures placed.  Wound has been doing well at home without any signs of infection.  On arrival to the ED vitals are stable.  Patient has a well-healed laceration with sutures in place without any signs of infection at this time including erythema, edema, drainage.  Sutures have been removed at this time.  Patient instructed on wound care.  She is in agreement with plan and stable for discharge.  This note was prepared using 32 and  may include unintentional dictation errors due to  the inherent limitations of voice recognition software.   Final Clinical Impression(s) / ED Diagnoses Final diagnoses:  Visit for suture removal    Rx / DC Orders ED Discharge Orders    None       Discharge Instructions     Please see attached information on suture removal after care.  You can apply bacitracin (neosporin) to the wound to help with healing. Keep wound clean and dry.  Return to the ED for any signs of infection including redness/swelling, drainage of pus, fevers > 100.4        Tanda Rockers, PA-C 04/30/20 1038    Maia Plan, MD 05/07/20 1954

## 2021-02-24 ENCOUNTER — Other Ambulatory Visit: Payer: Self-pay

## 2021-02-24 ENCOUNTER — Emergency Department (HOSPITAL_BASED_OUTPATIENT_CLINIC_OR_DEPARTMENT_OTHER): Payer: BLUE CROSS/BLUE SHIELD

## 2021-02-24 ENCOUNTER — Emergency Department (HOSPITAL_BASED_OUTPATIENT_CLINIC_OR_DEPARTMENT_OTHER)
Admission: EM | Admit: 2021-02-24 | Discharge: 2021-02-24 | Disposition: A | Discharge status: Home / Self Care | Payer: BLUE CROSS/BLUE SHIELD | Attending: Emergency Medicine | Admitting: Emergency Medicine

## 2021-02-24 ENCOUNTER — Encounter (HOSPITAL_BASED_OUTPATIENT_CLINIC_OR_DEPARTMENT_OTHER): Payer: Self-pay

## 2021-02-24 DIAGNOSIS — N899 Noninflammatory disorder of vagina, unspecified: Secondary | ICD-10-CM | POA: Diagnosis not present

## 2021-02-24 DIAGNOSIS — R112 Nausea with vomiting, unspecified: Secondary | ICD-10-CM | POA: Diagnosis not present

## 2021-02-24 DIAGNOSIS — K219 Gastro-esophageal reflux disease without esophagitis: Secondary | ICD-10-CM | POA: Insufficient documentation

## 2021-02-24 DIAGNOSIS — R102 Pelvic and perineal pain: Secondary | ICD-10-CM | POA: Insufficient documentation

## 2021-02-24 DIAGNOSIS — R1031 Right lower quadrant pain: Secondary | ICD-10-CM | POA: Insufficient documentation

## 2021-02-24 LAB — CBC WITH DIFFERENTIAL/PLATELET
Abs Immature Granulocytes: 0.04 10*3/uL (ref 0.00–0.07)
Basophils Absolute: 0.1 10*3/uL (ref 0.0–0.1)
Basophils Relative: 1 %
Eosinophils Absolute: 0 10*3/uL (ref 0.0–0.5)
Eosinophils Relative: 0 %
HCT: 40.6 % (ref 36.0–46.0)
Hemoglobin: 13.1 g/dL (ref 12.0–15.0)
Immature Granulocytes: 0 %
Lymphocytes Relative: 8 %
Lymphs Abs: 1 10*3/uL (ref 0.7–4.0)
MCH: 26.7 pg (ref 26.0–34.0)
MCHC: 32.3 g/dL (ref 30.0–36.0)
MCV: 82.7 fL (ref 80.0–100.0)
Monocytes Absolute: 0.2 10*3/uL (ref 0.1–1.0)
Monocytes Relative: 1 %
Neutro Abs: 10.6 10*3/uL — ABNORMAL HIGH (ref 1.7–7.7)
Neutrophils Relative %: 90 %
Platelets: 262 10*3/uL (ref 150–400)
RBC: 4.91 MIL/uL (ref 3.87–5.11)
RDW: 12.3 % (ref 11.5–15.5)
WBC: 11.8 10*3/uL — ABNORMAL HIGH (ref 4.0–10.5)
nRBC: 0 % (ref 0.0–0.2)

## 2021-02-24 LAB — COMPREHENSIVE METABOLIC PANEL
ALT: 23 U/L (ref 0–44)
AST: 20 U/L (ref 15–41)
Albumin: 4.2 g/dL (ref 3.5–5.0)
Alkaline Phosphatase: 45 U/L (ref 38–126)
Anion gap: 8 (ref 5–15)
BUN: 15 mg/dL (ref 6–20)
CO2: 24 mmol/L (ref 22–32)
Calcium: 9 mg/dL (ref 8.9–10.3)
Chloride: 103 mmol/L (ref 98–111)
Creatinine, Ser: 0.64 mg/dL (ref 0.44–1.00)
GFR, Estimated: >60 mL/min (ref >60)
Glucose, Bld: 117 mg/dL — ABNORMAL HIGH (ref 70–99)
Potassium: 3.8 mmol/L (ref 3.5–5.1)
Sodium: 135 mmol/L (ref 135–145)
Total Bilirubin: 0.2 mg/dL — ABNORMAL LOW (ref 0.3–1.2)
Total Protein: 7.1 g/dL (ref 6.5–8.1)

## 2021-02-24 LAB — WET PREP, GENITAL
Clue Cells Wet Prep HPF POC: NONE SEEN
Sperm: NONE SEEN
Trich, Wet Prep: NONE SEEN
WBC, Wet Prep HPF POC: <10 (ref <10)
Yeast Wet Prep HPF POC: NONE SEEN

## 2021-02-24 LAB — URINALYSIS, ROUTINE W REFLEX MICROSCOPIC
Bilirubin Urine: NEGATIVE
Glucose, UA: NEGATIVE mg/dL
Hgb urine dipstick: NEGATIVE
Ketones, ur: NEGATIVE mg/dL
Leukocytes,Ua: NEGATIVE
Nitrite: NEGATIVE
Protein, ur: NEGATIVE mg/dL
Specific Gravity, Urine: 1.03 (ref 1.005–1.030)
pH: 6.5 (ref 5.0–8.0)

## 2021-02-24 LAB — PREGNANCY, URINE: Preg Test, Ur: NEGATIVE

## 2021-02-24 LAB — LIPASE, BLOOD: Lipase: 30 U/L (ref 11–51)

## 2021-02-24 MED ORDER — ONDANSETRON HCL 4 MG/2ML IJ SOLN
4.0000 mg | Freq: Once | INTRAMUSCULAR | Status: AC
  Administered 2021-02-24: 4 mg via INTRAVENOUS
  Filled 2021-02-24: qty 2

## 2021-02-24 MED ORDER — ONDANSETRON HCL 4 MG PO TABS: 4.0000 mg | ORAL_TABLET | Freq: Three times a day (TID) | ORAL | 0 refills | Status: AC | PRN

## 2021-02-24 MED ORDER — FENTANYL CITRATE PF 50 MCG/ML IJ SOSY
50.0000 ug | PREFILLED_SYRINGE | Freq: Once | INTRAMUSCULAR | Status: AC
  Administered 2021-02-24: 50 ug via INTRAVENOUS
  Filled 2021-02-24: qty 1

## 2021-02-24 NOTE — Discharge Instructions (Signed)
Recommend Tylenol and Motrin as needed for pain control.  Take Zofran as needed for nausea.  If you develop worsening abdominal pain, vomiting, fever, or other new concerning symptom, please return to ER for reassessment.  Recommend follow-up with primary care doctor.

## 2021-02-24 NOTE — ED Triage Notes (Signed)
Per video interpreter-pt c/o RLQ pain, n/v/d started ~8am

## 2021-02-24 NOTE — ED Provider Notes (Signed)
[facility] EMERGENCY DEPARTMENT Provider Note   CSN: [id number] Arrival date & time: 02/24/21  1113     History Chief Complaint  Patient presents with   Abdominal Pain    Erika Keith is a 35 y.o. female.  Presenting to the ER with concern for abdominal pain.  Patient has history of prior C-section, appendectomy.  Pain ongoing since this morning, had some associated nausea and vomiting.  Pain is primarily in her right lower abdomen, right pelvic region but also extends to the left side at times.  Relatively constant.  Moderate to severe.  Last week noted small vaginal discharge, no discharge today.  No vaginal bleeding.  No pain with urination or blood in urine today.  Utilize [place] interpreter throughout visit.  HPI     Past Medical History:  Diagnosis Date   Depression    GERD (gastroesophageal reflux disease)    Gestational diabetes    diet controlled    Patient Active Problem List   Diagnosis Date Noted   Postoperative state 07/19/2016    Past Surgical History:  Procedure Laterality Date   APPENDECTOMY     CESAREAN SECTION     CESAREAN SECTION WITH BILATERAL TUBAL LIGATION Bilateral 07/19/2016   Procedure: REPEAT CESAREAN SECTION WITH BILATERAL TUBAL LIGATION;  Surgeon: Carrington ClampMichelle Horvath, MD;  [facility];  Service: Obstetrics;  Laterality: Bilateral;     OB History     Gravida  3   Para  2   Term  2   Preterm      AB  1   Living  2      SAB  1   IAB      Ectopic      Multiple  0   Live Births  2           Family History  Problem Relation Age of Onset   Alcohol abuse Neg Hx    Asthma Neg Hx    Arthritis Neg Hx    Birth defects Neg Hx    Cancer Neg Hx    COPD Neg Hx    Depression Neg Hx    Diabetes Neg Hx    Drug abuse Neg Hx    Early death Neg Hx    Hearing loss Neg Hx    Heart disease Neg Hx    Hyperlipidemia Neg Hx    Hypertension Neg Hx    Kidney disease Neg Hx    Learning disabilities  Neg Hx    Mental illness Neg Hx    Mental retardation Neg Hx    Miscarriages / Stillbirths Neg Hx    Stroke Neg Hx    Vision loss Neg Hx    Varicose Veins Neg Hx     Social History   Tobacco Use   Smoking status: Never   Smokeless tobacco: Never  Vaping Use   Vaping Use: Never used  Substance Use Topics   Alcohol use: No   Drug use: No    Home Medications Prior to Admission medications   Medication Sig Start Date End Date Taking? Authorizing Provider  ondansetron (ZOFRAN) 4 MG tablet Take 1 tablet (4 mg total) by mouth every 8 (eight) hours as needed for nausea or vomiting. 02/24/21  Yes Milagros Lollykstra, Jessamy Torosyan S, MD  oxyCODONE (OXY IR/ROXICODONE) 5 MG immediate release tablet Take 1 tablet (5 mg total) by mouth every 4 (four) hours as needed (pain scale 4-7). 07/21/16   Philip Aspenallahan, [place], DO  Prenatal Vit-Fe  Fumarate-FA (PRENATAL VITAMIN PO) Take 1 tablet by mouth daily.     [provider]  ranitidine (ZANTAC) 150 MG tablet Take 1 tablet (150 mg total) by mouth 2 (two) times daily. 12/01/15   Jorje Guild, NP    Allergies    Zoloft [sertraline]  Review of Systems   Review of Systems  Constitutional:  Negative for chills, fatigue and fever.  HENT:  Negative for ear pain and sore throat.   Eyes:  Negative for pain and visual disturbance.  Respiratory:  Negative for cough and shortness of breath.   Cardiovascular:  Negative for chest pain and palpitations.  Gastrointestinal:  Positive for abdominal pain. Negative for vomiting.  Genitourinary:  Positive for pelvic pain. Negative for dysuria and hematuria.  Musculoskeletal:  Negative for arthralgias and back pain.  Skin:  Negative for color change and rash.  Neurological:  Negative for seizures and syncope.  All other systems reviewed and are negative.  Physical Exam Updated Vital Signs BP 110/72   Pulse 78   Temp 98.7 F (37.1 C) (Oral)   Resp 18   Ht 5' (1.524 m)   Wt 55.3 kg   LMP 02/11/2021   SpO2 100%   BMI  23.83 kg/m   Physical Exam Vitals and nursing note reviewed.  Constitutional:      General: She is not in acute distress.    Appearance: She is well-developed.  HENT:     Head: Normocephalic and atraumatic.  Eyes:     Conjunctiva/sclera: Conjunctivae normal.  Cardiovascular:     Rate and Rhythm: Normal rate and regular rhythm.     Heart sounds: No murmur heard. Pulmonary:     Effort: Pulmonary effort is normal. No respiratory distress.     Breath sounds: Normal breath sounds.  Abdominal:     Palpations: Abdomen is soft.     Tenderness: There is no abdominal tenderness.     Comments: Some TTP in RLQ and LLQ, no rebound or guarding  Genitourinary:    Comments: Vagina appears normal, there is small white discharge present, cervix appears normal, some right adnexal tenderness and left adnexal tenderness, there is no cervical motion tenderness  Electrical engineer Musculoskeletal:        General: No swelling.     Cervical back: Neck supple.  Skin:    General: Skin is warm and dry.     Capillary Refill: Capillary refill takes less than 2 seconds.  Neurological:     Mental Status: She is alert.  Psychiatric:        Mood and Affect: Mood normal.    ED Results / Procedures / Treatments   Labs (all labs ordered are listed, but only abnormal results are displayed) Labs Reviewed  CBC WITH DIFFERENTIAL/PLATELET - Abnormal; Notable for the following components:      Result Value   WBC 11.8 (*)    Neutro Abs 10.6 (*)    All other components within normal limits  COMPREHENSIVE METABOLIC PANEL - Abnormal; Notable for the following components:   Glucose, Bld 117 (*)    Total Bilirubin 0.2 (*)    All other components within normal limits  URINALYSIS, ROUTINE W REFLEX MICROSCOPIC - Abnormal; Notable for the following components:   Color, Urine AMBER (*)    All other components within normal limits  WET PREP, GENITAL  LIPASE, BLOOD  PREGNANCY, URINE  GC/CHLAMYDIA PROBE AMP  ([place]) NOT AT [facility]    EKG None  Radiology US PELVIC  COMPLETE W TRANSVAGINAL AND TORSION R/O  Result Date: 02/24/2021 CLINICAL DATA:  Quadrant pain question ovarian torsion, LMP 02/11/2021 EXAM: TRANSABDOMINAL AND TRANSVAGINAL ULTRASOUND OF PELVIS DOPPLER ULTRASOUND OF OVARIES TECHNIQUE: Both transabdominal and transvaginal ultrasound examinations of the pelvis were performed. Transabdominal technique was performed for global imaging of the pelvis including uterus, ovaries, adnexal regions, and pelvic cul-de-sac. It was necessary to proceed with endovaginal exam following the transabdominal exam to visualize the lower uterine segment/cervix and adnexa. Color and duplex Doppler ultrasound was utilized to evaluate blood flow to the ovaries. COMPARISON:  None FINDINGS: Uterus Measurements: 8.3 x 3.9 x 5.8 cm = volume: 97 mL. Anteverted. Mildly heterogeneous myometrium. Several tiny uterine leiomyomata are identified, including a 7 mm intramural leiomyoma at posterior upper uterus, a 9 mm subserosal leiomyoma at posterior upper uterus, and a 13 mm subserosal leiomyoma at anterior mid uterus. Endometrium Thickness: 7 mm.  No endometrial fluid or mass Right ovary Measurements: 3.4 x 2.7 x 2.2 cm = volume: 10.3 mL. Normal morphology without mass Left ovary Measurements: 2.7 x 1.1 x 1.5 cm = volume: 3.1 mL. Normal morphology without mass. Pulsed Doppler evaluation of both ovaries demonstrates normal low-resistance arterial and venous waveforms in both ovaries, less well demonstrated on LEFT. Other findings No free pelvic fluid or adnexal masses. IMPRESSION: Tiny uterine leiomyomata. Otherwise negative exam. Specifically, no evidence of RIGHT ovarian torsion. Electronically Signed   By: Ulyses Southward M.D.   On: 02/24/2021 14:06    Procedures Procedures   Medications Ordered in ED Medications  fentaNYL (SUBLIMAZE) injection 50 mcg (50 mcg Intravenous Given 02/24/21 1239)  ondansetron (ZOFRAN) injection 4  mg (4 mg Intravenous Given 02/24/21 1237)    ED Course  I have reviewed the triage vital signs and the nursing notes.  Pertinent labs & imaging results that were available during my care of the patient were reviewed by me and considered in my medical decision making (see chart for details).    MDM Rules/Calculators/A&P                           35 year old lady presenting to ER with concern for right lower quadrant abdominal pain.  Has history of appendectomy per review of chart.  [place] speaking, utilized Electronics engineer.  Some tenderness across lower abdomen noted.  Performed pelvic exam, bilateral adnexal tenderness noted but no cervical motion tenderness.  UA negative for infection.  No blood in urine.  Transvaginal ultrasound negative for acute gynecologic pathology.  Reassessed patient to discuss results and her pain and nausea has completely subsided.  Given reassuring work-up thus far today and lack of ongoing symptoms, believe she can be discharged and managed for the outpatient setting.  Recommended follow-up with primary care doctor.  Discharged home.  After the discussed management above, the patient was determined to be safe for discharge.  The patient was in agreement with this plan and all questions regarding their care were answered.  ED return precautions were discussed and the patient will return to the ED with any significant worsening of condition.   Final Clinical Impression(s) / ED Diagnoses Final diagnoses:  Right lower quadrant abdominal pain  Pelvic pain    Rx / DC Orders ED Discharge Orders          Ordered    ondansetron (ZOFRAN) 4 MG tablet  Every 8 hours PRN        02/24/21 1445  Lucrezia Starch, MD 02/24/21 [phone]

## 2021-02-25 LAB — GC/CHLAMYDIA PROBE AMP (~~LOC~~) NOT AT ARMC
Chlamydia: NEGATIVE
Comment: NEGATIVE
Comment: NORMAL
Neisseria Gonorrhea: NEGATIVE

## 2021-06-29 ENCOUNTER — Emergency Department (HOSPITAL_BASED_OUTPATIENT_CLINIC_OR_DEPARTMENT_OTHER)
Admission: EM | Admit: 2021-06-29 | Discharge: 2021-06-29 | Disposition: A | Discharge status: Home / Self Care | Payer: BLUE CROSS/BLUE SHIELD | Attending: Emergency Medicine | Admitting: Emergency Medicine

## 2021-06-29 ENCOUNTER — Other Ambulatory Visit: Payer: Self-pay

## 2021-06-29 ENCOUNTER — Encounter (HOSPITAL_BASED_OUTPATIENT_CLINIC_OR_DEPARTMENT_OTHER): Payer: Self-pay | Admitting: Emergency Medicine

## 2021-06-29 DIAGNOSIS — R059 Cough, unspecified: Secondary | ICD-10-CM | POA: Diagnosis present

## 2021-06-29 DIAGNOSIS — J069 Acute upper respiratory infection, unspecified: Secondary | ICD-10-CM

## 2021-06-29 MED ORDER — BENZONATATE 100 MG PO CAPS: 100.0000 mg | ORAL_CAPSULE | Freq: Three times a day (TID) | ORAL | 0 refills | Status: AC

## 2021-06-29 NOTE — ED Provider Notes (Signed)
?[facility] EMERGENCY DEPARTMENT ?Provider Note ? ? ?CSN: [id number] ?Arrival date & time: 06/29/21  0920 ? ?  ? ?History ? ?Chief Complaint  ?Patient presents with  ? Cough  ? ? ?Erika Keith is a 36 y.o. female presenting due to cough and sore throat.  Daughter was diagnosed with the flu on 3/24.  She was not tested but felt sick.  Currently still has a cough and sore throat.  No fevers.  No difficulty breathing or chest pain.  Has taken Tylenol and ibuprofen which have somewhat helped her symptoms.  Has not seen her PCP ? ? ?Cough ?Associated symptoms: chills, rhinorrhea and sore throat   ? ?  ? ?Home Medications ?Prior to Admission medications   ?Medication Sig Start Date End Date Taking? Authorizing Provider  ?ondansetron (ZOFRAN) 4 MG tablet Take 1 tablet (4 mg total) by mouth every 8 (eight) hours as needed for nausea or vomiting. 02/24/21   Milagros Loll, MD  ?oxyCODONE (OXY IR/ROXICODONE) 5 MG immediate release tablet Take 1 tablet (5 mg total) by mouth every 4 (four) hours as needed (pain scale 4-7). 07/21/16   Philip Aspen, DO  ?Prenatal Vit-Fe Fumarate-FA (PRENATAL VITAMIN PO) Take 1 tablet by mouth daily.     [provider]  ?ranitidine (ZANTAC) 150 MG tablet Take 1 tablet (150 mg total) by mouth 2 (two) times daily. 12/01/15   Judeth Horn, NP  ?   ? ?Allergies    ?Zoloft [sertraline]   ? ?Review of Systems   ?Review of Systems  ?Constitutional:  Positive for chills.  ?HENT:  Positive for congestion, rhinorrhea and sore throat.   ?Respiratory:  Positive for cough.   ?See HPI ?Physical Exam ?Updated Vital Signs ?BP 100/60 (BP Location: Left Arm)   Pulse 86   Temp 98.1 ?F (36.7 ?C) (Oral)   Resp 18   Ht 5' (1.524 m)   Wt 55.3 kg   LMP 06/20/2021   SpO2 99%   BMI 23.81 kg/m?  ?Physical Exam ?Vitals and nursing note reviewed.  ?Constitutional:   ?   General: She is not in acute distress. ?   Appearance: Normal appearance. She is not ill-appearing.  ?HENT:  ?   Head:  Normocephalic and atraumatic.  ?   Mouth/Throat:  ?   Mouth: Mucous membranes are moist.  ?   Pharynx: Oropharynx is clear.  ?Eyes:  ?   General: No scleral icterus. ?   Conjunctiva/sclera: Conjunctivae normal.  ?Cardiovascular:  ?   Rate and Rhythm: Normal rate and regular rhythm.  ?Pulmonary:  ?   Effort: Pulmonary effort is normal. No respiratory distress.  ?   Breath sounds: No wheezing.  ?Skin: ?   General: Skin is warm and dry.  ?   Findings: No rash.  ?Neurological:  ?   Mental Status: She is alert.  ?Psychiatric:     ?   Mood and Affect: Mood normal.  ? ? ?ED Results / Procedures / Treatments   ?Labs ?(all labs ordered are listed, but only abnormal results are displayed) ?Labs Reviewed - No data to display ? ?EKG ?None ? ?Radiology ?No results found. ? ?Procedures ?Procedures  ? ? ?Medications Ordered in ED ?Medications - No data to display ? ?ED Course/ Medical Decision Making/ A&P ?  ?                        ?Medical Decision Making ?Risk ?Prescription drug management. ? ? ?  36 year old presenting with cough and sore throat.  Airway clear, tolerating secretions.  No exudate on exam.  Patient likely had the flu when her daughter was diagnosed a week ago and continues to have these residual symptoms.  Will discharge home with Erika Keith and instruction to follow-up with PCP.  She is agreeable to this plan.  Chest x-ray considered however lung sounds are clear and patient was not complaining of difficulty breathing or chest pain. ? ?Entire evaluation done in the presence of iPad interpreter. ? ? ?Final Clinical Impression(s) / ED Diagnoses ?Final diagnoses:  ?Viral URI with cough  ? ? ?Rx / DC Orders ?ED Discharge Orders   ? ?      Ordered  ?  benzonatate (TESSALON) 100 MG capsule  Every 8 hours       ? 06/29/21 1126  ? ?  ?  ? ?  ? ?Results and diagnoses were explained to the patient. Return precautions discussed in full. Patient had no additional questions and expressed complete understanding. ? ? ?This  chart was dictated using voice recognition software.  Despite best efforts to proofread,  errors can occur which can change the documentation meaning.  ?  ?Saddie Benders, PA-C ?06/29/21 1215 ? ?  ?Alvira Monday, MD ?06/30/21 2200 ? ?

## 2021-06-29 NOTE — ED Triage Notes (Signed)
Reports cold symptoms for a week.  No fever.  Daughter recently diagnosed with flu. ?

## 2021-12-16 ENCOUNTER — Emergency Department (HOSPITAL_BASED_OUTPATIENT_CLINIC_OR_DEPARTMENT_OTHER)
Admission: EM | Admit: 2021-12-16 | Discharge: 2021-12-16 | Disposition: A | Discharge status: Home / Self Care | Payer: BLUE CROSS/BLUE SHIELD | Attending: Emergency Medicine | Admitting: Emergency Medicine

## 2021-12-16 ENCOUNTER — Encounter (HOSPITAL_BASED_OUTPATIENT_CLINIC_OR_DEPARTMENT_OTHER): Payer: Self-pay | Admitting: Emergency Medicine

## 2021-12-16 ENCOUNTER — Other Ambulatory Visit: Payer: Self-pay

## 2021-12-16 DIAGNOSIS — H5712 Ocular pain, left eye: Secondary | ICD-10-CM | POA: Insufficient documentation

## 2021-12-16 DIAGNOSIS — H5789 Other specified disorders of eye and adnexa: Secondary | ICD-10-CM | POA: Diagnosis not present

## 2021-12-16 MED ORDER — FLUORESCEIN SODIUM 1 MG OP STRP
1.0000 | ORAL_STRIP | Freq: Once | OPHTHALMIC | Status: AC
  Administered 2021-12-16: 1 via OPHTHALMIC

## 2021-12-16 MED ORDER — ERYTHROMYCIN 5 MG/GM OP OINT: TOPICAL_OINTMENT | OPHTHALMIC | 0 refills | Status: AC

## 2021-12-16 MED ORDER — FLUORESCEIN SODIUM 1 MG OP STRP
ORAL_STRIP | OPHTHALMIC | Status: AC
  Filled 2021-12-16: qty 1

## 2021-12-16 MED ORDER — TETRACAINE HCL 0.5 % OP SOLN
2.0000 [drp] | Freq: Once | OPHTHALMIC | Status: AC
  Administered 2021-12-16: 2 [drp] via OPHTHALMIC
  Filled 2021-12-16: qty 4

## 2021-12-16 NOTE — ED Provider Notes (Signed)
[facility] EMERGENCY DEPARTMENT Provider Note   CSN: [id number] Arrival date & time: 12/16/21  2140     History  Chief Complaint  Patient presents with   Eye Pain    Erika Keith is a 36 y.o. female with no significant past medical history who presents to the ED due to left eye irritation that started earlier this morning.  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  Patient washed her eye out prior to arrival with no relief.  Denies visual changes.  Patient states she has some pain with movement of her eye.  She also endorses clear drainage from eye.   History obtained from patient and past medical records. No interpreter used during encounter.       Home Medications Prior to Admission medications   Medication Sig Start Date End Date Taking? Authorizing Provider  erythromycin ophthalmic ointment Place a 1/2 inch ribbon of ointment into the lower eyelid. 12/16/21  Yes Demetries Coia, Druscilla Brownie, PA-C  benzonatate (TESSALON) 100 MG capsule Take 1 capsule (100 mg total) by mouth every 8 (eight) hours. 06/29/21   Redwine, Madison A, PA-C  ondansetron (ZOFRAN) 4 MG tablet Take 1 tablet (4 mg total) by mouth every 8 (eight) hours as needed for nausea or vomiting. 02/24/21   Lucrezia Starch, MD  oxyCODONE (OXY IR/ROXICODONE) 5 MG immediate release tablet Take 1 tablet (5 mg total) by mouth every 4 (four) hours as needed (pain scale 4-7). 07/21/16   Allyn Kenner, DO  Prenatal Vit-Fe Fumarate-FA (PRENATAL VITAMIN PO) Take 1 tablet by mouth daily.     [provider]  ranitidine (ZANTAC) 150 MG tablet Take 1 tablet (150 mg total) by mouth 2 (two) times daily. 12/01/15   Jorje Guild, NP      Allergies    Zoloft [sertraline]    Review of Systems   Review of Systems  Eyes:  Positive for pain and discharge. Negative for visual disturbance.    Physical Exam Updated Vital Signs BP 109/80 (BP Location: Left Arm)   Pulse 88   Temp 98.3 F (36.8 C)  (Oral)   Resp 16   Ht 5' (1.524 m)   Wt 54.4 kg   SpO2 100%   BMI 23.44 kg/m  Physical Exam Vitals and nursing note reviewed.  Constitutional:      General: She is not in acute distress.    Appearance: She is not ill-appearing.  HENT:     Head: Normocephalic.  Eyes:     General:        Left eye: Discharge present.    Pupils: Pupils are equal, round, and reactive to light.     Comments: Injected left conjunctiva. Right IOP 13, left 16.  No fluorescein uptake.  No hyphema.  EOMs intact.  Cardiovascular:     Rate and Rhythm: Normal rate and regular rhythm.     Pulses: Normal pulses.     Heart sounds: Normal heart sounds. No murmur heard.    No friction rub. No gallop.  Pulmonary:     Effort: Pulmonary effort is normal.     Breath sounds: Normal breath sounds.  Abdominal:     General: Abdomen is flat. There is no distension.     Palpations: Abdomen is soft.     Tenderness: There is no abdominal tenderness. There is no guarding or rebound.  Musculoskeletal:        General: Normal range of motion.  Cervical back: Neck supple.  Skin:    General: Skin is warm and dry.  Neurological:     General: No focal deficit present.     Mental Status: She is alert.  Psychiatric:        Mood and Affect: Mood normal.        Behavior: Behavior normal.     ED Results / Procedures / Treatments   Labs (all labs ordered are listed, but only abnormal results are displayed) Labs Reviewed - No data to display  EKG None  Radiology No results found.  Procedures Procedures    Medications Ordered in ED Medications  tetracaine (PONTOCAINE) 0.5 % ophthalmic solution 2 drop (2 drops Both Eyes Given 12/16/21 2206)  fluorescein ophthalmic strip 1 strip (1 strip Both Eyes Given 12/16/21 2207)  fluorescein 1 MG ophthalmic strip (  Given 12/16/21 2207)    ED Course/ Medical Decision Making/ A&P                           Medical Decision Making Risk Prescription drug  management.   36 year old female presents to the ED due to left eye irritation that started earlier this morning.  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  No visual changes.  Upon arrival, stable vitals.  Patient in no acute distress.  Left eye with injected conjunctiva. PERRL.  Normal IOP.  Doubt acute glaucoma.  No fluorescein uptake.  Low suspicion for corneal abrasion.  No hyphema.  Normal visual acuity.  Patient discharged with numbing eyedrops. Advised patient to only use drops for 24 hours then discard. Patient also discharged with Erythromycin ointment. Suspect irritation from dust.  Patient discharged with ophthalmology referral and advised to call if symptoms not improve over the next few days. Strict ED precautions discussed with patient. Patient states understanding and agrees to plan. Patient discharged home in no acute distress and stable vitals  Document critical care time when appropriate:1}      Final Clinical Impression(s) / ED Diagnoses Final diagnoses:  Left eye pain    Rx / DC Orders ED Discharge Orders          Ordered    erythromycin ophthalmic ointment        12/16/21 2222              Mannie Stabile, PA-C 12/16/21 2241    Sloan Leiter, DO 12/19/21 [id number]

## 2021-12-16 NOTE — Discharge Instructions (Addendum)
It was a pleasure taking care of you today. As discussed, your eye exam was normal. Continue to use eye drops as needed for pain. I have included the number of the eye doctor. Call to schedule an appointment if symptoms do not improve over the next few days. Return to the ER for new or worsening symptoms.   I am also sending you home with antibiotic ointment. Use as prescribed.  Only use numbing eye drops every 4 hours for 24 hours. Discard after 24 hours

## 2021-12-16 NOTE — ED Triage Notes (Signed)
Interpreter utilized. Patient c/o left eye irritation since this morning. Redness noted to eye.

## 2022-09-18 IMAGING — US US PELVIS COMPLETE TRANSABD/TRANSVAG W DUPLEX
1 series · 13 of 25 positions shown · non-contrast
Comparison: None

CLINICAL DATA: Quadrant pain question ovarian torsion, LMP
02/11/2021

EXAM:
TRANSABDOMINAL AND TRANSVAGINAL ULTRASOUND OF PELVIS
DOPPLER ULTRASOUND OF OVARIES
TECHNIQUE: Both transabdominal and transvaginal ultrasound examinations of the
pelvis were performed. Transabdominal technique was performed for
global imaging of the pelvis including uterus, ovaries, adnexal
regions, and pelvic cul-de-sac.
It was necessary to proceed with endovaginal exam following the
transabdominal exam to visualize the lower uterine segment/cervix
and adnexa. Color and duplex Doppler ultrasound was utilized to
evaluate blood flow to the ovaries.

[Series 1: us pelvis complete transabd/transvag w duplex · 13 of 136 slices shown]
[im 1/136]
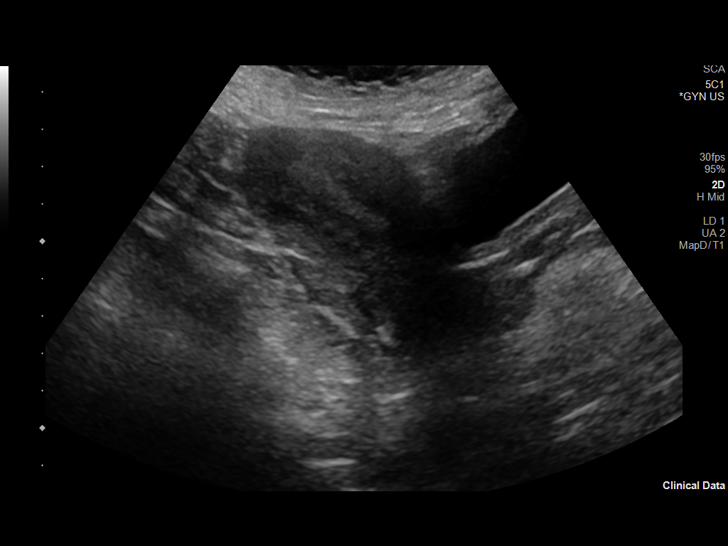
[im 12/136]
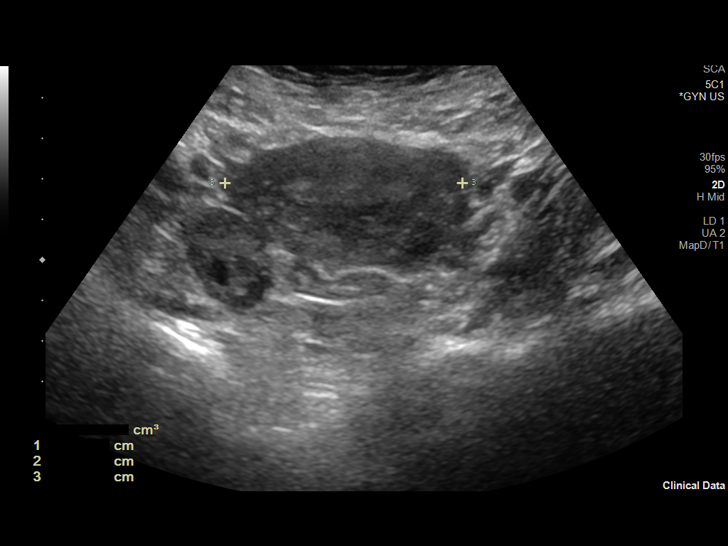
[im 23/136]
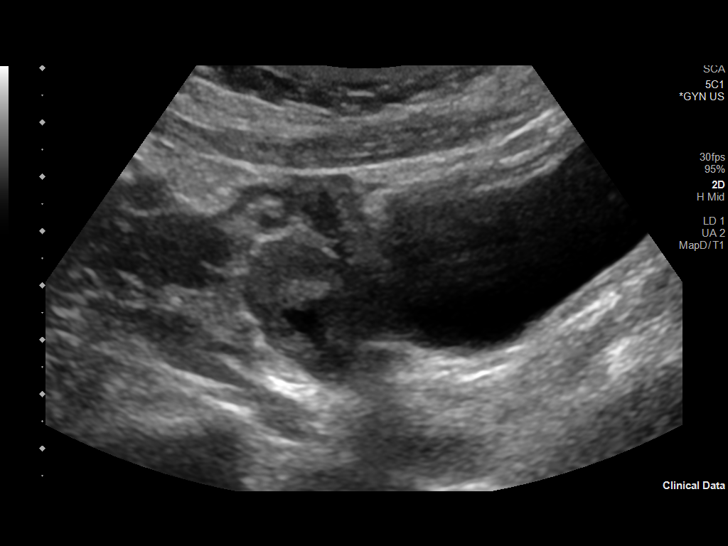
[im 34/136]
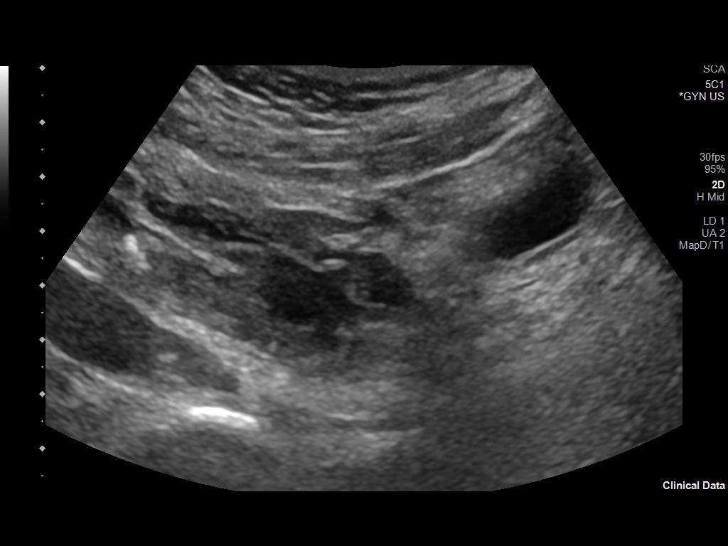
[im 46/136]
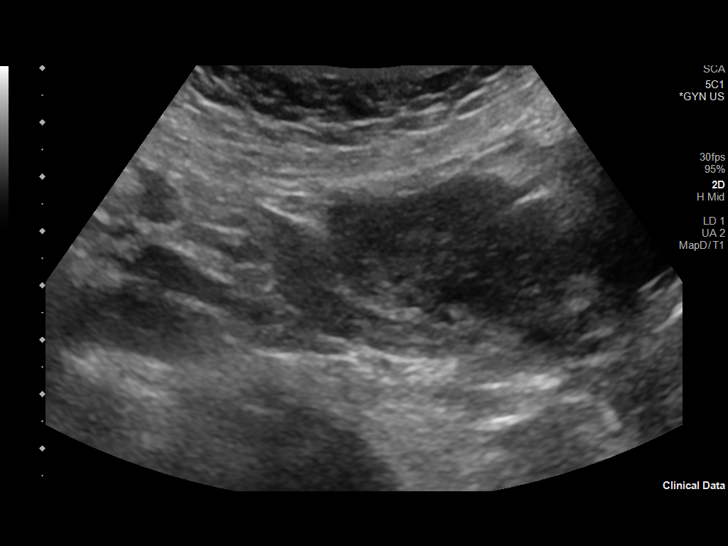
[im 57/136]
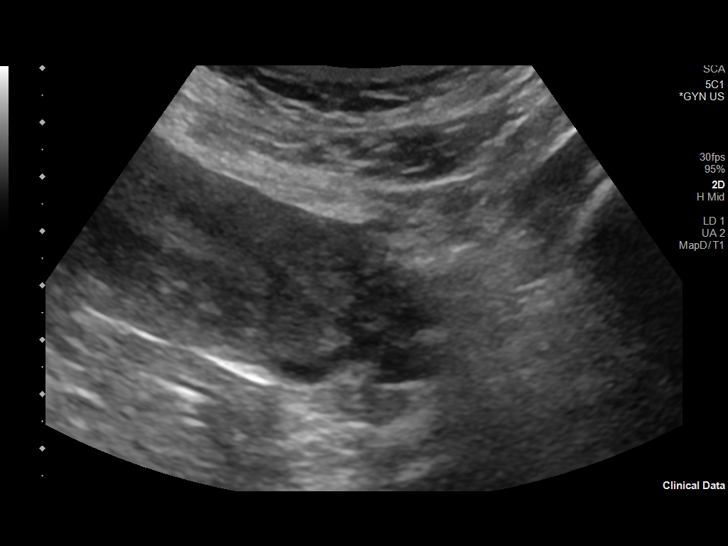
[im 68/136]
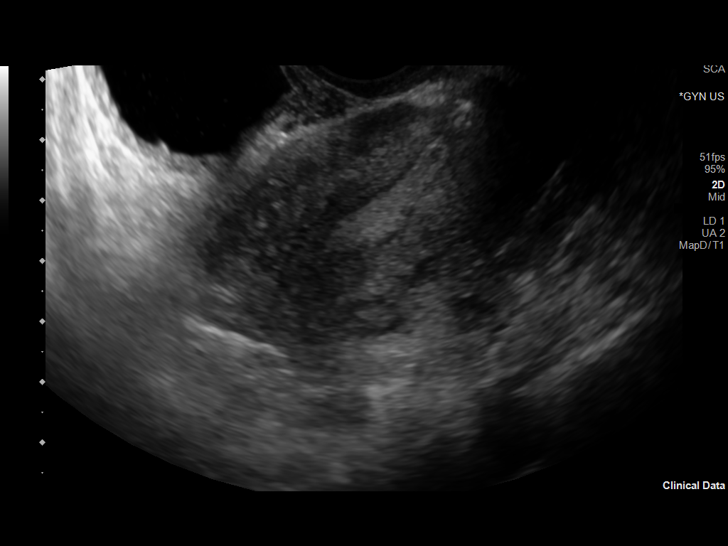
[im 79/136]
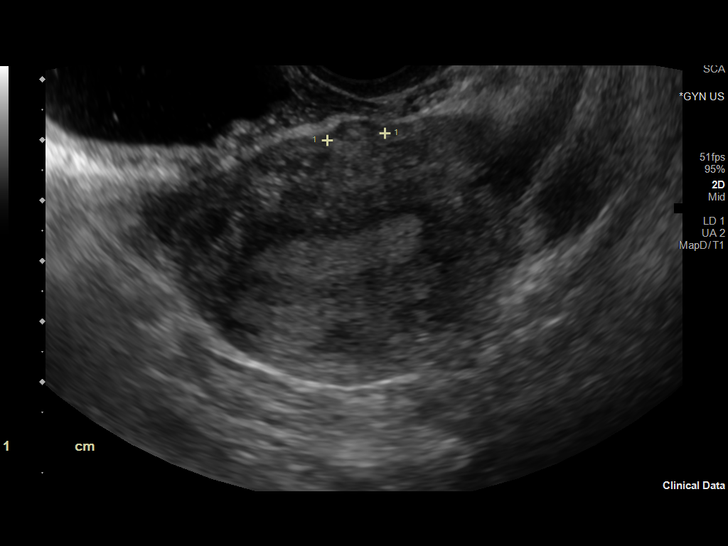
[im 91/136]
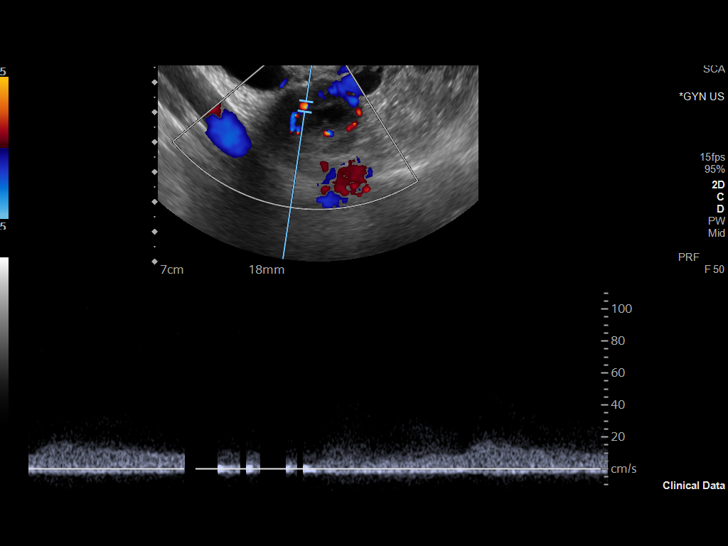
[im 102/136]
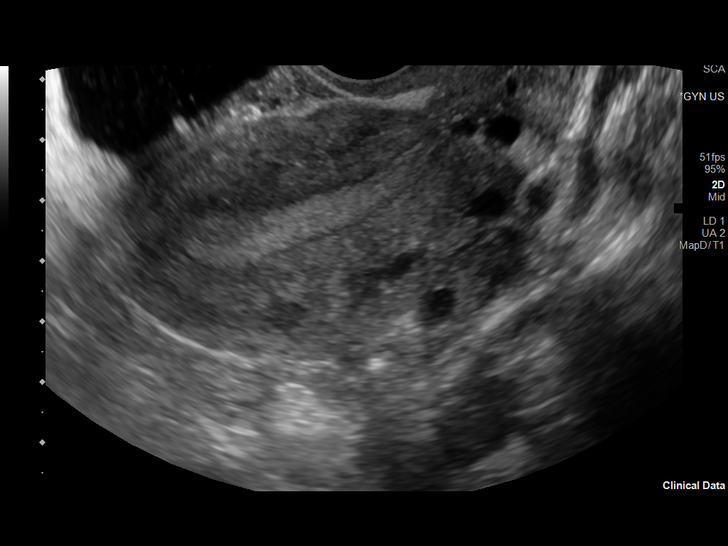
[im 113/136]
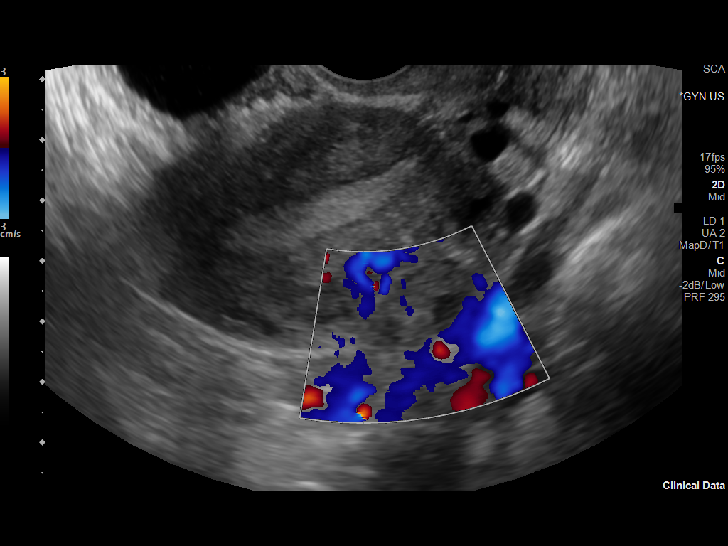
[im 124/136]
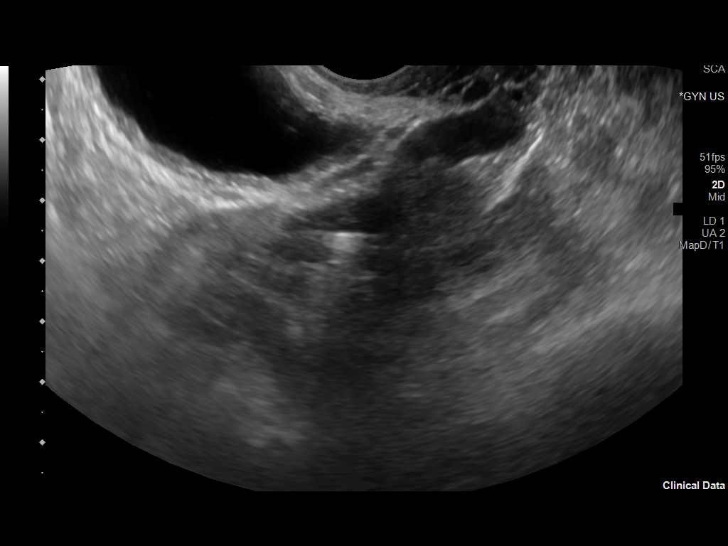
[im 136/136]
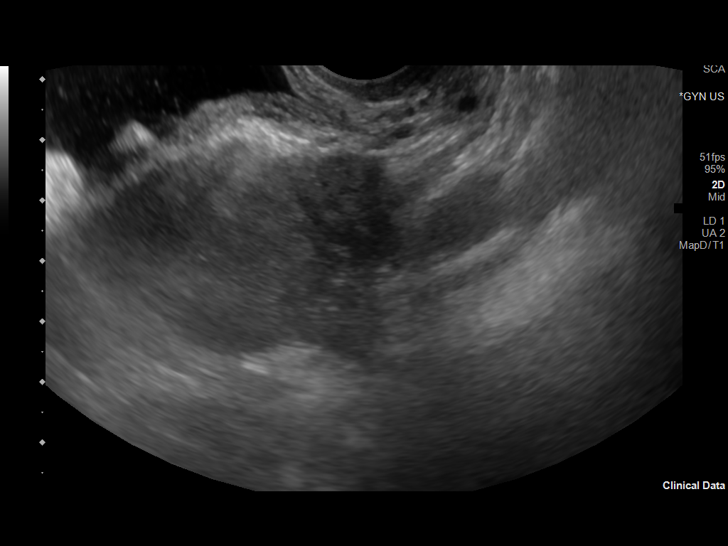

[13 of 25 positions shown; findings below may reference images not displayed]

FINDINGS: Uterus

Measurements: 8.3 x 3.9 x 5.8 cm = volume: 97 mL. Anteverted. Mildly
heterogeneous myometrium. Several tiny uterine leiomyomata are
identified, including a 7 mm intramural leiomyoma at posterior upper
uterus, a 9 mm subserosal leiomyoma at posterior upper uterus, and a
13 mm subserosal leiomyoma at anterior mid uterus.

Endometrium

Thickness: 7 mm.  No endometrial fluid or mass

Right ovary

Measurements: 3.4 x 2.7 x 2.2 cm = volume: 10.3 mL. Normal
morphology without mass

Left ovary

Measurements: 2.7 x 1.1 x 1.5 cm = volume: 3.1 mL. Normal morphology
without mass.

Pulsed Doppler evaluation of both ovaries demonstrates normal
low-resistance arterial and venous waveforms in both ovaries, less
well demonstrated on LEFT.

Other findings

No free pelvic fluid or adnexal masses.
IMPRESSION: Tiny uterine leiomyomata.

Otherwise negative exam.

Specifically, no evidence of RIGHT ovarian torsion.
